# Patient Record
Sex: Female | Born: 1985 | Race: White | Hispanic: No | Marital: Married | Smoking: Never smoker
Health system: Southern US, Community
[De-identification: ages and names within clinical notes are randomized; demographics above are authoritative.]

## PROBLEM LIST (undated history)

## (undated) ENCOUNTER — Inpatient Hospital Stay (HOSPITAL_COMMUNITY): Payer: Self-pay

## (undated) DIAGNOSIS — Z8742 Personal history of other diseases of the female genital tract: Secondary | ICD-10-CM

## (undated) DIAGNOSIS — R51 Headache: Secondary | ICD-10-CM

## (undated) DIAGNOSIS — N390 Urinary tract infection, site not specified: Secondary | ICD-10-CM

## (undated) DIAGNOSIS — J45909 Unspecified asthma, uncomplicated: Secondary | ICD-10-CM

## (undated) DIAGNOSIS — O139 Gestational [pregnancy-induced] hypertension without significant proteinuria, unspecified trimester: Secondary | ICD-10-CM

## (undated) DIAGNOSIS — D649 Anemia, unspecified: Secondary | ICD-10-CM

## (undated) DIAGNOSIS — Z8619 Personal history of other infectious and parasitic diseases: Secondary | ICD-10-CM

## (undated) DIAGNOSIS — R519 Headache, unspecified: Secondary | ICD-10-CM

## (undated) HISTORY — DX: Unspecified asthma, uncomplicated: J45.909

## (undated) HISTORY — DX: Anemia, unspecified: D64.9

## (undated) HISTORY — DX: Gestational (pregnancy-induced) hypertension without significant proteinuria, unspecified trimester: O13.9

## (undated) HISTORY — DX: Headache, unspecified: R51.9

## (undated) HISTORY — DX: Personal history of other diseases of the female genital tract: Z87.42

## (undated) HISTORY — DX: Personal history of other infectious and parasitic diseases: Z86.19

## (undated) HISTORY — DX: Headache: R51

## (undated) HISTORY — PX: DIAGNOSTIC LAPAROSCOPY: SUR761

## (undated) HISTORY — DX: Urinary tract infection, site not specified: N39.0

---

## 2015-11-01 ENCOUNTER — Other Ambulatory Visit: Payer: Self-pay | Admitting: Urology

## 2015-11-01 DIAGNOSIS — G35 Multiple sclerosis: Secondary | ICD-10-CM

## 2015-11-01 DIAGNOSIS — R338 Other retention of urine: Secondary | ICD-10-CM

## 2015-11-10 ENCOUNTER — Other Ambulatory Visit (HOSPITAL_COMMUNITY): Payer: BC Managed Care – PPO

## 2015-11-11 ENCOUNTER — Ambulatory Visit (INDEPENDENT_AMBULATORY_CARE_PROVIDER_SITE_OTHER): Payer: BC Managed Care – PPO | Admitting: Neurology

## 2015-11-11 ENCOUNTER — Encounter: Payer: Self-pay | Admitting: Neurology

## 2015-11-11 VITALS — BP 135/90 | HR 88 | Ht 64.0 in | Wt 167.6 lb

## 2015-11-11 DIAGNOSIS — N390 Urinary tract infection, site not specified: Secondary | ICD-10-CM | POA: Insufficient documentation

## 2015-11-11 DIAGNOSIS — N319 Neuromuscular dysfunction of bladder, unspecified: Secondary | ICD-10-CM | POA: Diagnosis not present

## 2015-11-11 NOTE — Patient Instructions (Addendum)
I had a long discussion with the patient and her husband regarding her symptoms of frequent urinary tract infections and a   finding of inadequate emptying of the bladder in the urologist office however he clearly has not had significant neurological symptoms to suggest   multiple sclerosis. Her neurological exam is fairly unremarkable except for hyperreflexia which may be due to underlying anxiety. Doing further brain imaging studies to evaluate for multiple sclerosis or compressive lesions in the spine which may cause neurogenic bladder may be beneficial. The patient and husband want some time to think about this and make a decision. I have advised him about possible symptoms of multiple sclerosis and to call if she has no symptoms developing in the future or trouble with walking or balance. She will continue workup with her urologist and empty her bladder frequently every few hours and may in fact consider seeing a gynecologist since her frequent UTIs have started only after she became sexually active last year. She may return for follow-up in the future only as necessary Neurogenic Bladder Neurogenic bladder is a bladder control disorder. It is caused by problems with the nerves that control your bladder. This condition may make your bladder overactive or underactive. You may have trouble holding your urine or passing your urine (urinating). The bladder is a hollow organ in the lower part of your abdomen. It stores urine after the urine is made by your kidneys. Normally, when your bladder is not full, the muscles that control your bladder are relaxed. When your bladder fills with urine, nerve signals are sent to your brain, indicating that your bladder is full. Your brain then sends signals through your spinal cord to the muscles in your bladder that start and stop urine flow. If you have neurogenic bladder, the nerves and muscles do not work together the way they should. CAUSES  Any kind of nerve damage or  condition that disrupts the signals from your brain to your bladder can cause neurogenic bladder. Many things can cause these nerve problems. They include:  A disease that affects the nervous system, such as:  Alzheimer disease.  Cerebral palsy.  Multiple sclerosis.  Diabetes.  Parkinson disease.  Damage to your brain or spinal cord from:  Trauma.  Tumor.  Infection.  Surgery.  Alcohol abuse.  Stroke.  A spinal cord birth defect. RISK FACTORS Having nerve damage or a nerve disorder puts you at risk for neurogenic bladder. SIGNS AND SYMPTOMS  Signs and symptoms of neurogenic bladder include:  Leaking or gushing urine (incontinence).  A sudden, strong urge to pass urine (urgency).  Frequent urination during the day and night.  Being unable to empty your bladder completely (urinary retention).  Frequent urinary tract infections. DIAGNOSIS  Your health care provider may diagnose neurogenic bladder based on your symptoms and medical history. A physical exam will also be done. You may be asked to keep a record of your bladder symptoms and the times that you urinate (bladder diary). Your health care provider may also do several tests to help diagnose neurogenic bladder, including:  A urine test to check for infection.  A bladder scan after you urinate to see how much urine is left in your bladder.  Various tests to measure your urine flow and see how well the flow is controlled (urodynamic tests).  A procedure that involves using a tool that is like a very thin telescope to look through your urethra into your bladder (cystoscopy). A health care provider who specializes  in the urinary tract (urologist) may do this test.  Imaging tests of your brain or spine. TREATMENT  Treatment depends on the cause of your neurogenic bladder and the symptoms you are having. Work closely with your health care provider to find the treatments that will improve your quality of life.  Treatment options include:  Learning ways to control when you urinate, such as:  Urinating at scheduled times.  Training yourself to delay urination.  Doing exercises (Kegel exercises) to strengthen the muscles that control urine flow.  Avoiding foods or drinks that make your symptoms worse.  Taking medicines to:  Stimulate an underactive bladder.  Relax an overactive bladder.  Treat a urinary tract infection.  Learning how to use a thin tube (catheter) to empty your bladder. A catheter is a hollow tube that you pass through your urethra.  Procedures to stimulate the nerves that control your bladder.  Surgical procedures if other treatments do not help. HOME CARE INSTRUCTIONS   Keep a bladder diary to find out which foods, liquids, or activities make your symptoms worse.  Use your bladder diary to schedule bathroom trips. If you are away from home, plan to be near a bathroom when your schedule says you need one.  Do Kegel exercises to strengthen the muscles that control urination. These muscles are the ones you use to try to hold urine when you need to go. To do Kegel exercises:  Squeeze these muscles tight and hold for about 10 seconds.  Repeat three times.  Do these exercises often during the day when you do not have to urinate.  Limit your drinking of beverages that stimulate urination. These include soda, coffee, and tea.  After urinating, wait a few minutes and try again (double voiding).  Make sure you urinate just before you leave the house and just before you go to bed.  Take medicines only as directed by your health care provider.  Keep all follow-up visits as directed by your health care provider. This is important. SEEK MEDICAL CARE IF:   You are having a hard time controlling your symptoms.  Your symptoms are getting worse.  You have signs of a urinary tract infection:  A burning feeling when you urinate.  Chills.  Fever. SEEK IMMEDIATE MEDICAL  CARE IF:  You cannot pass urine.    This information is not intended to replace advice given to you by your health care provider. Make sure you discuss any questions you have with your health care provider.   Document Released: 01/28/2007 Document Revised: 08/07/2014 Document Reviewed: 10/28/2013 Elsevier Interactive Patient Education [employer]

## 2015-11-11 NOTE — Progress Notes (Signed)
[facility] [address] [place]. [place] [address] [phone]       OFFICE CONSULT NOTE  [name] Date of Birth:  1985/11/13 Medical Record Number:  [id number]   Referring MD:  Dr Ronne Binning Reason for Referral:  Neurogenic bladder, frequent UTIs  HPI: Ms Anne Russell is a 86 year Caucasian lady who has had frequent urinary tract infections since August 2016. She got married in July 2016 and had not been sexually active prior to that. She had 2 episodes of culture positive UTI treated with antibiotics. She was seen by urologist and had some trouble emptying her bladder in the office. The available records sent with this referral do not indicate urodynamic studies. Patient denies neurological symptoms in the form of back pain, radicular pain, perianal anesthesia, vertigo, diplopia, gait or balance problems or excessive fatigue. 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 She has occasional tension headaches but no other neurological symptoms. She has not had any imaging study done of the brain or spine. She has no history of frequent falls, trauma to her spine, motor vehicle accident degenerative spine disease. The patient states the initial UTIs began a day or 2 after having sex. More recently there does not seem to be any correlation between her symptoms of bladder, pelvic pain and dysuria and sex.  ROS:   14 system review of systems is positive for frequent UTI, incomplete bladder emptying, anxiety, stress and all other systems negative  PMH:  Past Medical History  Diagnosis Date  . Asthma   . UTI (lower urinary tract infection)     Social History:  Social History   Social History  . Marital Status: Married    Spouse Name: N/A  . Number of Children: N/A  . Years of Education: N/A   Occupational History  . Not on file.   Social History Main Topics  . Smoking status: Never Smoker   . Smokeless tobacco: Not on file  .  Alcohol Use: 0.6 oz/week    1 Glasses of wine per week     Comment: occasionally  . Drug Use: Not on file  . Sexual Activity: Not on file   Other Topics Concern  . Not on file   Social History Narrative    Medications:   No current outpatient prescriptions on file prior to visit.   No current facility-administered medications on file prior to visit.    Allergies:   Allergies  Allergen Reactions  . Sulfa Antibiotics     Physical Exam General: well developed, well nourished Young Caucasian lady, seated, in no evident distress Head: head normocephalic and atraumatic.   Neck: supple with no carotid or supraclavicular bruits Cardiovascular: regular rate and rhythm, no murmurs Musculoskeletal: no deformity Skin:  no rash/petichiae Vascular:  Normal pulses all extremities  Neurologic Exam Mental Status: Awake and fully alert. Oriented to place and time. Recent and remote memory intact. Attention span, concentration and fund of knowledge appropriate. Mood and affect appropriate.  Cranial Nerves: Fundoscopic exam reveals sharp disc margins. Pupils equal, briskly reactive to light.No afferent pupillary defect. Extraocular movements full without nystagmus. Visual fields full to confrontation. Hearing intact. Facial sensation intact. Face, tongue, palate moves normally and symmetrically.  Motor: Normal bulk and tone. Normal strength in all tested extremity muscles. Sensory.: intact to touch , pinprick , position and vibratory sensation.  Coordination: Rapid alternating movements normal in all extremities. Finger-to-nose and heel-to-shin performed accurately bilaterally. Gait and Station:  Arises from chair without difficulty. Stance is normal. Gait demonstrates normal stride length and balance . Able to heel, toe and tandem walk without difficulty.  Reflexes: 2+ brisk and symmetric. Toes downgoing.       ASSESSMENT: 51 year Caucasian lady with a recurrent urinary tract infections  with incomplete bladder emptying of unclear etiology. Primary neurogenic bladder due to cauda equina lesion or multiple sclerosis-like illnesses unlikely given lack of any other associated symptoms and patient's UTIs beginning after she became sexually active. Neurological exam is unremarkable except for mild hyperreflexia likely related to underlying anxiety    PLAN: I had a long discussion with the patient and her husband regarding her symptoms of frequent urinary tract infections and a   finding of inadequate emptying of the bladder in the urologist office however he clearly has not had significant neurological symptoms to suggest   multiple sclerosis. Her neurological exam is fairly unremarkable except for hyperreflexia which may be due to underlying anxiety. Doing further brain imaging studies to evaluate for multiple sclerosis or compressive lesions in the spine which may cause neurogenic bladder may be beneficial. The patient and husband want some time to think about this and make a decision. I have advised him about possible symptoms of multiple sclerosis and to call if she has no symptoms developing in the future or trouble with walking or balance. She will continue workup with her urologist and empty her bladder frequently every few hours and may in fact consider seeing a gynecologist since her frequent UTIs have started only after she became sexually active last year. She may return for follow-up in the future only as necessary Delia Heady, MD Note: This document was prepared with digital dictation and possible smart phrase technology. Any transcriptional errors that result from this process are unintentional.

## 2016-03-21 LAB — OB RESULTS CONSOLE HIV ANTIBODY (ROUTINE TESTING): HIV: NONREACTIVE

## 2016-03-21 LAB — OB RESULTS CONSOLE HEPATITIS B SURFACE ANTIGEN: Hepatitis B Surface Ag: NEGATIVE

## 2016-03-21 LAB — OB RESULTS CONSOLE RUBELLA ANTIBODY, IGM: Rubella: IMMUNE

## 2016-03-21 LAB — OB RESULTS CONSOLE ABO/RH: RH TYPE: NEGATIVE

## 2016-03-21 LAB — OB RESULTS CONSOLE RPR: RPR: NONREACTIVE

## 2016-03-21 LAB — OB RESULTS CONSOLE GC/CHLAMYDIA
Chlamydia: NEGATIVE
Gonorrhea: NEGATIVE

## 2016-03-21 LAB — OB RESULTS CONSOLE ANTIBODY SCREEN: Antibody Screen: NEGATIVE

## 2016-07-31 NOTE — L&D Delivery Note (Signed)
Delivery Note At 9:42 PM a viable female was delivered via Vaginal, Spontaneous Delivery (Presentation:OA ;  ).  APGAR: 8, 9; weight  .   Placenta statusspont>>intact: , .  Cord:  with the following complications:loose nuchal X 1  .  Cord pH: not sent  Anesthesia:  epid + loc Episiotomy: None Lacerations:  Sec deg + L periuretharal Suture Repair: 3.0 vicryl rapide Est. Blood Loss (mL):  300  Mom to postpartum.  Baby to Couplet care / Skin to Skin.  Meriel Pica 10/12/2016, 10:05 PM

## 2016-10-11 ENCOUNTER — Telehealth (HOSPITAL_COMMUNITY): Payer: Self-pay | Admitting: *Deleted

## 2016-10-11 ENCOUNTER — Other Ambulatory Visit: Payer: Self-pay | Admitting: Obstetrics and Gynecology

## 2016-10-11 ENCOUNTER — Encounter (HOSPITAL_COMMUNITY): Payer: Self-pay | Admitting: *Deleted

## 2016-10-11 LAB — OB RESULTS CONSOLE GBS: GBS: NEGATIVE

## 2016-10-11 NOTE — Telephone Encounter (Signed)
Preadmission screen  

## 2016-10-12 ENCOUNTER — Inpatient Hospital Stay (HOSPITAL_COMMUNITY): Payer: BC Managed Care – PPO | Admitting: Anesthesiology

## 2016-10-12 ENCOUNTER — Encounter (HOSPITAL_COMMUNITY): Payer: Self-pay

## 2016-10-12 ENCOUNTER — Inpatient Hospital Stay (HOSPITAL_COMMUNITY)
Admission: RE | Admit: 2016-10-12 | Discharge: 2016-10-14 | DRG: 775 | Disposition: A | Discharge status: Home / Self Care | Payer: BC Managed Care – PPO | Source: Ambulatory Visit | Attending: Obstetrics and Gynecology | Admitting: Obstetrics and Gynecology

## 2016-10-12 VITALS — BP 132/82 | HR 82 | Temp 98.3°F | Resp 18 | Ht 64.0 in | Wt 186.0 lb

## 2016-10-12 DIAGNOSIS — O134 Gestational [pregnancy-induced] hypertension without significant proteinuria, complicating childbirth: Secondary | ICD-10-CM | POA: Diagnosis present

## 2016-10-12 DIAGNOSIS — Z3A38 38 weeks gestation of pregnancy: Secondary | ICD-10-CM

## 2016-10-12 DIAGNOSIS — Z8249 Family history of ischemic heart disease and other diseases of the circulatory system: Secondary | ICD-10-CM | POA: Diagnosis not present

## 2016-10-12 DIAGNOSIS — O139 Gestational [pregnancy-induced] hypertension without significant proteinuria, unspecified trimester: Secondary | ICD-10-CM | POA: Diagnosis present

## 2016-10-12 LAB — CBC
HCT: 40.6 % (ref 36.0–46.0)
HEMATOCRIT: 41.9 % (ref 36.0–46.0)
HEMOGLOBIN: 13.8 g/dL (ref 12.0–15.0)
Hemoglobin: 14.4 g/dL (ref 12.0–15.0)
MCH: 30.2 pg (ref 26.0–34.0)
MCH: 30.6 pg (ref 26.0–34.0)
MCHC: 34 g/dL (ref 30.0–36.0)
MCHC: 34.4 g/dL (ref 30.0–36.0)
MCV: 88.8 fL (ref 78.0–100.0)
MCV: 89.1 fL (ref 78.0–100.0)
PLATELETS: 163 10*3/uL (ref 150–400)
Platelets: 155 10*3/uL (ref 150–400)
RBC: 4.57 MIL/uL (ref 3.87–5.11)
RBC: 4.7 MIL/uL (ref 3.87–5.11)
RDW: 13.1 % (ref 11.5–15.5)
RDW: 13.1 % (ref 11.5–15.5)
WBC: 14.3 10*3/uL — ABNORMAL HIGH (ref 4.0–10.5)
WBC: 18.6 10*3/uL — AB (ref 4.0–10.5)

## 2016-10-12 LAB — RPR: RPR Ser Ql: NONREACTIVE

## 2016-10-12 LAB — ABO/RH: ABO/RH(D): O NEG

## 2016-10-12 LAB — TYPE AND SCREEN
ABO/RH(D): O NEG
ANTIBODY SCREEN: NEGATIVE

## 2016-10-12 MED ORDER — OXYTOCIN 40 UNITS IN LACTATED RINGERS INFUSION - SIMPLE MED
2.5000 [IU]/h | INTRAVENOUS | Status: DC
  Filled 2016-10-12: qty 1000

## 2016-10-12 MED ORDER — LIDOCAINE HCL (PF) 1 % IJ SOLN
30.0000 mL | INTRAMUSCULAR | Status: DC | PRN
  Administered 2016-10-12: 30 mL via SUBCUTANEOUS
  Filled 2016-10-12 (×2): qty 30

## 2016-10-12 MED ORDER — LACTATED RINGERS IV SOLN: 500.0000 mL | INTRAVENOUS | Status: DC | PRN

## 2016-10-12 MED ORDER — ACETAMINOPHEN 325 MG PO TABS
650.0000 mg | ORAL_TABLET | ORAL | Status: DC | PRN
  Administered 2016-10-13 – 2016-10-14 (×4): 650 mg via ORAL
  Filled 2016-10-12 (×4): qty 2

## 2016-10-12 MED ORDER — OXYCODONE-ACETAMINOPHEN 5-325 MG PO TABS
2.0000 | ORAL_TABLET | ORAL | Status: DC | PRN
  Filled 2016-10-12: qty 2

## 2016-10-12 MED ORDER — DIPHENHYDRAMINE HCL 50 MG/ML IJ SOLN: 12.5000 mg | INTRAMUSCULAR | Status: DC | PRN

## 2016-10-12 MED ORDER — ZOLPIDEM TARTRATE 5 MG PO TABS: 5.0000 mg | ORAL_TABLET | Freq: Every evening | ORAL | Status: DC | PRN

## 2016-10-12 MED ORDER — TERBUTALINE SULFATE 1 MG/ML IJ SOLN
0.2500 mg | Freq: Once | INTRAMUSCULAR | Status: DC | PRN
  Filled 2016-10-12: qty 1

## 2016-10-12 MED ORDER — DIBUCAINE 1 % RE OINT
1.0000 "application " | TOPICAL_OINTMENT | RECTAL | Status: DC | PRN
  Administered 2016-10-13 – 2016-10-14 (×2): 1 via RECTAL
  Filled 2016-10-12 (×2): qty 28

## 2016-10-12 MED ORDER — MISOPROSTOL 25 MCG QUARTER TABLET
25.0000 ug | ORAL_TABLET | ORAL | Status: DC | PRN
  Administered 2016-10-12 (×2): 25 ug via VAGINAL
  Filled 2016-10-12 (×2): qty 0.25
  Filled 2016-10-12: qty 1

## 2016-10-12 MED ORDER — FLEET ENEMA 7-19 GM/118ML RE ENEM: 1.0000 | ENEMA | RECTAL | Status: DC | PRN

## 2016-10-12 MED ORDER — SOD CITRATE-CITRIC ACID 500-334 MG/5ML PO SOLN: 30.0000 mL | ORAL | Status: DC | PRN

## 2016-10-12 MED ORDER — IBUPROFEN 800 MG PO TABS
800.0000 mg | ORAL_TABLET | Freq: Three times a day (TID) | ORAL | Status: DC | PRN
  Administered 2016-10-13 – 2016-10-14 (×5): 800 mg via ORAL
  Filled 2016-10-12 (×5): qty 1

## 2016-10-12 MED ORDER — WITCH HAZEL-GLYCERIN EX PADS
1.0000 "application " | MEDICATED_PAD | CUTANEOUS | Status: DC | PRN
  Administered 2016-10-13: 1 via TOPICAL

## 2016-10-12 MED ORDER — BISACODYL 10 MG RE SUPP: 10.0000 mg | Freq: Every day | RECTAL | Status: DC | PRN

## 2016-10-12 MED ORDER — OXYTOCIN 40 UNITS IN LACTATED RINGERS INFUSION - SIMPLE MED
1.0000 m[IU]/min | INTRAVENOUS | Status: DC
  Administered 2016-10-12: 2 m[IU]/min via INTRAVENOUS

## 2016-10-12 MED ORDER — MEASLES, MUMPS & RUBELLA VAC ~~LOC~~ INJ
0.5000 mL | INJECTION | Freq: Once | SUBCUTANEOUS | Status: DC
  Filled 2016-10-12: qty 0.5

## 2016-10-12 MED ORDER — PHENYLEPHRINE 40 MCG/ML (10ML) SYRINGE FOR IV PUSH (FOR BLOOD PRESSURE SUPPORT)
80.0000 ug | PREFILLED_SYRINGE | INTRAVENOUS | Status: DC | PRN
  Filled 2016-10-12: qty 10
  Filled 2016-10-12: qty 5

## 2016-10-12 MED ORDER — ACETAMINOPHEN 325 MG PO TABS: 650.0000 mg | ORAL_TABLET | ORAL | Status: DC | PRN

## 2016-10-12 MED ORDER — NALOXONE HCL 0.4 MG/ML IJ SOLN
INTRAMUSCULAR | Status: AC
  Filled 2016-10-12: qty 1

## 2016-10-12 MED ORDER — FLEET ENEMA 7-19 GM/118ML RE ENEM: 1.0000 | ENEMA | Freq: Every day | RECTAL | Status: DC | PRN

## 2016-10-12 MED ORDER — SENNOSIDES-DOCUSATE SODIUM 8.6-50 MG PO TABS
2.0000 | ORAL_TABLET | ORAL | Status: DC
  Administered 2016-10-13 (×2): 2 via ORAL
  Filled 2016-10-12 (×2): qty 2

## 2016-10-12 MED ORDER — PHENYLEPHRINE 40 MCG/ML (10ML) SYRINGE FOR IV PUSH (FOR BLOOD PRESSURE SUPPORT)
80.0000 ug | PREFILLED_SYRINGE | INTRAVENOUS | Status: DC | PRN
  Filled 2016-10-12: qty 5
  Filled 2016-10-12: qty 10

## 2016-10-12 MED ORDER — PRENATAL MULTIVITAMIN CH: 1.0000 | ORAL_TABLET | Freq: Every day | ORAL | Status: DC

## 2016-10-12 MED ORDER — TETANUS-DIPHTH-ACELL PERTUSSIS 5-2.5-18.5 LF-MCG/0.5 IM SUSP: 0.5000 mL | Freq: Once | INTRAMUSCULAR | Status: DC

## 2016-10-12 MED ORDER — ONDANSETRON HCL 4 MG/2ML IJ SOLN: 4.0000 mg | INTRAMUSCULAR | Status: DC | PRN

## 2016-10-12 MED ORDER — BUTORPHANOL TARTRATE 1 MG/ML IJ SOLN
1.0000 mg | Freq: Once | INTRAMUSCULAR | Status: AC
  Administered 2016-10-12: 1 mg via INTRAVENOUS
  Filled 2016-10-12: qty 1

## 2016-10-12 MED ORDER — LACTATED RINGERS IV SOLN
500.0000 mL | Freq: Once | INTRAVENOUS | Status: AC
  Administered 2016-10-12: 500 mL via INTRAVENOUS

## 2016-10-12 MED ORDER — SIMETHICONE 80 MG PO CHEW: 80.0000 mg | CHEWABLE_TABLET | ORAL | Status: DC | PRN

## 2016-10-12 MED ORDER — BENZOCAINE-MENTHOL 20-0.5 % EX AERO
1.0000 "application " | INHALATION_SPRAY | CUTANEOUS | Status: DC | PRN
  Administered 2016-10-13: 1 via TOPICAL
  Filled 2016-10-12: qty 56

## 2016-10-12 MED ORDER — ONDANSETRON HCL 4 MG PO TABS: 4.0000 mg | ORAL_TABLET | ORAL | Status: DC | PRN

## 2016-10-12 MED ORDER — LACTATED RINGERS IV SOLN
INTRAVENOUS | Status: DC
  Administered 2016-10-12: 01:00:00 via INTRAVENOUS

## 2016-10-12 MED ORDER — OXYTOCIN BOLUS FROM INFUSION
500.0000 mL | Freq: Once | INTRAVENOUS | Status: AC
  Administered 2016-10-12: 500 mL via INTRAVENOUS

## 2016-10-12 MED ORDER — DIPHENHYDRAMINE HCL 25 MG PO CAPS: 25.0000 mg | ORAL_CAPSULE | Freq: Four times a day (QID) | ORAL | Status: DC | PRN

## 2016-10-12 MED ORDER — EPHEDRINE 5 MG/ML INJ
10.0000 mg | INTRAVENOUS | Status: DC | PRN
  Filled 2016-10-12: qty 2

## 2016-10-12 MED ORDER — OXYCODONE-ACETAMINOPHEN 5-325 MG PO TABS: 1.0000 | ORAL_TABLET | ORAL | Status: DC | PRN

## 2016-10-12 MED ORDER — LIDOCAINE HCL (PF) 1 % IJ SOLN
INTRAMUSCULAR | Status: DC | PRN
  Administered 2016-10-12: 8 mL via EPIDURAL
  Administered 2016-10-12: 7 mL via EPIDURAL

## 2016-10-12 MED ORDER — FENTANYL 2.5 MCG/ML BUPIVACAINE 1/10 % EPIDURAL INFUSION (WH - ANES)
14.0000 mL/h | INTRAMUSCULAR | Status: DC | PRN
  Filled 2016-10-12: qty 100

## 2016-10-12 MED ORDER — COCONUT OIL OIL: 1.0000 "application " | TOPICAL_OIL | Status: DC | PRN

## 2016-10-12 MED ORDER — ONDANSETRON HCL 4 MG/2ML IJ SOLN: 4.0000 mg | Freq: Four times a day (QID) | INTRAMUSCULAR | Status: DC | PRN

## 2016-10-12 NOTE — Anesthesia Preprocedure Evaluation (Signed)
Anesthesia Evaluation  Patient identified by MRN, date of birth, ID band Patient awake    Reviewed: Allergy & Precautions, H&P , NPO status , Patient's Chart, lab work & pertinent test results  Airway Mallampati: I  TM Distance: >3 FB Neck ROM: full    Dental no notable dental hx.    Pulmonary    Pulmonary exam normal breath sounds clear to auscultation       Cardiovascular Normal cardiovascular exam     Neuro/Psych negative psych ROS   GI/Hepatic negative GI ROS, Neg liver ROS,   Endo/Other  negative endocrine ROS  Renal/GU negative Renal ROS     Musculoskeletal   Abdominal (+) + obese,   Peds  Hematology   Anesthesia Other Findings   Reproductive/Obstetrics (+) Pregnancy                             Anesthesia Physical Anesthesia Plan  ASA: II  Anesthesia Plan: Epidural   Post-op Pain Management:    Induction:   Airway Management Planned:   Additional Equipment:   Intra-op Plan:   Post-operative Plan:   Informed Consent: I have reviewed the patients History and Physical, chart, labs and discussed the procedure including the risks, benefits and alternatives for the proposed anesthesia with the patient or authorized representative who has indicated his/her understanding and acceptance.     Plan Discussed with:   Anesthesia Plan Comments:         Anesthesia Quick Evaluation

## 2016-10-12 NOTE — Progress Notes (Signed)
1.5 cm>>>ISE for AROM>>>clear, discussed pain mgmt, FHR cat I

## 2016-10-12 NOTE — Anesthesia Pain Management Evaluation Note (Signed)
  CRNA Pain Management Visit Note  Patient: Anne Russell, 31 y.o., female  "Hello I am a member of the anesthesia team at [facility]. We have an anesthesia team available at all times to provide care throughout the hospital, including epidural management and anesthesia for C-section. I don't know your plan for the delivery whether it a natural birth, water birth, IV sedation, nitrous supplementation, doula or epidural, but we want to meet your pain goals."   1.Was your pain managed to your expectations on prior hospitalizations?   No prior hospitalizations  2.What is your expectation for pain management during this hospitalization?     Epidural  3.How can we help you reach that goal? epidural  Record the patient's initial score and the patient's pain goal.   Pain: 0  Pain Goal: 5 [facility] wants you to be able to say your pain was always managed very well.  Lauren Aguayo 10/12/2016

## 2016-10-12 NOTE — H&P (Signed)
Anne Russell is a 31 y.o. female presenting for IOL due to gest HTN, PIH labs in office WNL 2 days ago. OB History    Gravida Para Term Preterm AB Living   1             SAB TAB Ectopic Multiple Live Births                 Past Medical History:  Diagnosis Date  . Anemia   . Asthma   . Headache   . History of endometriosis   . Hx of varicella   . UTI (lower urinary tract infection)    Past Surgical History:  Procedure Laterality Date  . DIAGNOSTIC LAPAROSCOPY     Family History: family history includes Breast cancer in her maternal aunt and paternal aunt; Colon cancer in her maternal grandmother; Heart attack in her maternal grandfather; Hyperlipidemia in her mother; Kidney Stones in her father; Urinary tract infection in her mother. Social History:  reports that she has never smoked. She has never used smokeless tobacco. She reports that she drinks about 0.6 oz of alcohol per week . Her drug history is not on file.     Maternal Diabetes: No Genetic Screening: Normal Maternal Ultrasounds/Referrals: Normal Fetal Ultrasounds or other Referrals:  None Maternal Substance Abuse:  No Significant Maternal Medications:  None Significant Maternal Lab Results:  None Other Comments:  None  ROS History Dilation: Fingertip Effacement (%): 50 Station: -3 Exam by:: AThersa Salt RN  Blood pressure 129/73, pulse 69, temperature 97.3 F (36.3 C), temperature source Oral, resp. rate 16, height 5\' 4"  (1.626 m), weight 84.4 kg (186 lb), last menstrual period 01/19/2016. Exam Physical Exam  Constitutional: She is oriented to person, place, and time. She appears well-developed and well-nourished.  HENT:  Head: Normocephalic and atraumatic.  Neck: Normal range of motion.  Cardiovascular: Normal rate and regular rhythm.   Respiratory: Effort normal and breath sounds normal.  GI:  Term FH, FHR 142  Genitourinary:  Genitourinary Comments: cx: cl/post/vtx  Musculoskeletal: Normal range of  motion.  Neurological: She is alert and oriented to person, place, and time.    Prenatal labs: ABO, Rh: --/--/O NEG (03/15 0125) Antibody: NEG (03/15 0123) Rubella: Immune (08/22 0000) RPR: Nonreactive (08/22 0000)  HBsAg: Negative (08/22 0000)  HIV: Non-reactive (08/22 0000)  GBS: Negative (03/14 0000)   Assessment/Plan: Term IUP, gest HTN   Lary Eckardt M 10/12/2016, 7:38 AM

## 2016-10-12 NOTE — Anesthesia Procedure Notes (Signed)
Epidural Patient location during procedure: OB Start time: 10/12/2016 8:39 PM End time: 10/12/2016 8:41 PM  Staffing Anesthesiologist: Leilani Able Performed: anesthesiologist   Preanesthetic Checklist Completed: patient identified, surgical consent, pre-op evaluation, timeout performed, IV checked, risks and benefits discussed and monitors and equipment checked  Epidural Patient position: sitting Prep: site prepped and draped and DuraPrep Patient monitoring: continuous pulse ox and blood pressure Approach: midline Location: L3-L4 Injection technique: LOR air  Needle:  Needle type: Tuohy  Needle gauge: 17 G Needle length: 9 cm and 9 Needle insertion depth: 4 cm Catheter type: closed end flexible Catheter size: 19 Gauge Catheter at skin depth: 9 cm Test dose: negative and Other  Assessment Sensory level: T9 Events: blood not aspirated, injection not painful, no injection resistance, negative IV test and no paresthesia  Additional Notes Reason for block:procedure for pain

## 2016-10-13 ENCOUNTER — Inpatient Hospital Stay (HOSPITAL_COMMUNITY): Admission: RE | Admit: 2016-10-13 | Payer: BC Managed Care – PPO | Source: Ambulatory Visit

## 2016-10-13 LAB — CBC
HCT: 35.6 % — ABNORMAL LOW (ref 36.0–46.0)
Hemoglobin: 12.2 g/dL (ref 12.0–15.0)
MCH: 30.5 pg (ref 26.0–34.0)
MCHC: 34.3 g/dL (ref 30.0–36.0)
MCV: 89 fL (ref 78.0–100.0)
PLATELETS: 140 10*3/uL — AB (ref 150–400)
RBC: 4 MIL/uL (ref 3.87–5.11)
RDW: 13.2 % (ref 11.5–15.5)
WBC: 18.5 10*3/uL — AB (ref 4.0–10.5)

## 2016-10-13 NOTE — Progress Notes (Signed)
Post Partum Day 1 Subjective: no complaints, up ad lib, voiding and tolerating PO  Objective: Blood pressure 135/73, pulse 72, temperature 97.4 F (36.3 C), temperature source Oral, resp. rate 17, height 5\' 4"  (1.626 m), weight 186 lb (84.4 kg), last menstrual period 01/19/2016, SpO2 99 %, unknown if currently breastfeeding.  Physical Exam:  General: alert and cooperative Lochia: appropriate Uterine Fundus: firm Incision: healing well DVT Evaluation: No evidence of DVT seen on physical exam. Negative Homan's sign. No cords or calf tenderness. No significant calf/ankle edema.   Recent Labs  10/12/16 2015 10/13/16 0548  HGB 14.4 12.2  HCT 41.9 35.6*    Assessment/Plan: Plan for discharge tomorrow and Circumcision prior to discharge   LOS: 1 day   CURTIS,CAROL G 10/13/2016, 8:27 AM

## 2016-10-13 NOTE — Lactation Note (Signed)
This note was copied from a baby's chart. Lactation Consultation Note  Patient Name: Anne Russell [id number] Date: 10/13/2016 Reason for consult: Initial assessment;Infant < 6lbs Breastfeeding consultation services and support information given and reviewed.  I also gave parents Caring For Your Late Preterm Baby handout and explained due to baby's small size much of the information applies to their baby.  Mom has been hand expressing and spoon feeding colostrum.  Baby is currently very sleepy.  Assisted with positioning baby skin to skin in football hold.  Colostrum easily started with hand expression.  Baby only holds nipple in his mouth with no sucking attempt. Worked with suck training using a gloved finger with colostrum.  Baby would not suck initially but after several minutes of working with baby he began suckling.  3 mls of colostrum given with a curved tip syringe.  Baby tolerated well but still a little gaggy/spitty.  Symphony pump set up and initiated.  Instructed mom to attempt latching baby with cues, post pump x 15 minutes, hand expression and supplement with any expressed milk using curved tip syringe.  Instructed parents to call for RN assist with curved tip syringe next feeding.  Mom has a Medela pump in style at home.  Maternal Data Has patient been taught Hand Expression?: Yes Does the patient have breastfeeding experience prior to this delivery?: No  Feeding Feeding Type: Breast Fed  LATCH Score/Interventions Latch: Too sleepy or reluctant, no latch achieved, no sucking elicited. Intervention(s): Skin to skin;Teach feeding cues;Waking techniques Intervention(s): Breast compression;Breast massage;Assist with latch;Adjust position  Audible Swallowing: None Intervention(s): Skin to skin;Hand expression  Type of Nipple: Everted at rest and after stimulation  Comfort (Breast/Nipple): Soft / non-tender     Hold (Positioning): Assistance needed to correctly position  infant at breast and maintain latch. Intervention(s): Breastfeeding basics reviewed;Support Pillows;Position options;Skin to skin  LATCH Score: 5  Lactation Tools Discussed/Used Pump Review: Setup, frequency, and cleaning;Milk Storage Initiated by:: LC Date initiated:: 10/13/16   Consult Status Consult Status: Follow-up Date: 10/14/16 Follow-up type: In-patient    Huston Foley 10/13/2016, 2:48 PM

## 2016-10-13 NOTE — Anesthesia Postprocedure Evaluation (Signed)
Anesthesia Post Note  Patient: Anne Russell  Procedure(s) Performed: * No procedures listed *  Patient location during evaluation: Mother Baby Anesthesia Type: Epidural Level of consciousness: awake Pain management: pain level controlled Vital Signs Assessment: post-procedure vital signs reviewed and stable Respiratory status: spontaneous breathing Cardiovascular status: stable Postop Assessment: no headache, no backache, epidural receding and patient able to bend at knees Anesthetic complications: no        Last Vitals:  Vitals:   10/13/16 0525 10/13/16 0613  BP: (!) 154/73 135/73  Pulse: (!) 58 72  Resp: 17   Temp: 36.3 C     Last Pain:  Vitals:   10/13/16 0525  TempSrc: Oral  PainSc: 2    Pain Goal:                 Edison Pace

## 2016-10-13 NOTE — Plan of Care (Signed)
Problem: Pain Managment: Goal: General experience of comfort will improve Outcome: Progressing Patient is managing her perineal and rectal discomfort with po meds, spray, ice and Tuks pads and ointment. She is taking tylenol and Ibuprofen as needed. She is aware of other medications but does not want due to potential for constipation. Informed of stool softeners ordered, increased fluids and fiber in diet. Patient will call if pain med needed.

## 2016-10-14 MED ORDER — IBUPROFEN 600 MG PO TABS: 600.0000 mg | ORAL_TABLET | Freq: Four times a day (QID) | ORAL | 0 refills | Status: DC | PRN

## 2016-10-14 MED ORDER — FERROUS SULFATE 325 (65 FE) MG PO TBEC: 325.0000 mg | DELAYED_RELEASE_TABLET | Freq: Two times a day (BID) | ORAL | 2 refills | Status: DC

## 2016-10-14 MED ORDER — DOCUSATE SODIUM 100 MG PO CAPS: 100.0000 mg | ORAL_CAPSULE | Freq: Two times a day (BID) | ORAL | 2 refills | Status: DC

## 2016-10-14 NOTE — Plan of Care (Signed)
Problem: Pain Management: Goal: General experience of comfort will improve and pain level will decrease Outcome: Progressing Patient with hemorrhoid pain. Seat cushion given and educated/ administered a sitz bath.

## 2016-10-14 NOTE — Progress Notes (Signed)
Post Partum Day 2 Subjective: no complaints, up ad lib, voiding and tolerating PO. No CP, SOB, RUQ pain, epigastric pain, etc.   Objective: Vitals:   10/13/16 1845 10/14/16 0530  BP: (!) 146/92 132/82  Pulse: 96 82  Resp: 16 18  Temp: 98.1 F (36.7 C) 98.3 F (36.8 C)   , last menstrual period 01/19/2016, SpO2 99 %,breastfeeding.  Physical Exam:  General: alert and cooperative Lochia: appropriate Uterine Fundus: firm Incision: healing well DVT Evaluation: No evidence of DVT seen on physical exam. Negative Homan's sign. No cords or calf tenderness. No significant calf/ankle edema.   Recent Labs (last 2 labs)    Recent Labs  10/12/16 2015 10/13/16 0548  HGB 14.4 12.2  HCT 41.9 35.6*      Assessment/Plan: Plan for discharge today. H/o gHTN - mild elevated in BP - no s/s pre-eclampsia. Will have BP check in clinic in one week.   Rosie Fate MD

## 2016-10-14 NOTE — Lactation Note (Signed)
This note was copied from a baby's chart. Lactation Consultation Note: Baby just back from circ and too sleepy to nurse. Mom reports he has done better the last few feeding using a NS. Mom pumping and obtaining a few drops of colostrum. Has not fed since 5:30 am. Suggested pumping and offering finger feeding. #5 fr feeding tube/syringe given with instructions for setup, use and cleaning of pieces to supplement at the breast or by finger feeding. No questions at present. To call for assist when baby wakes for feeding. Possible DC later today.   Patient Name: Anne Russell [id number] Date: 10/14/2016 Reason for consult: Follow-up assessment;Infant < 6lbs   Maternal Data Formula Feeding for Exclusion: No Has patient been taught Hand Expression?: Yes Does the patient have breastfeeding experience prior to this delivery?: No  Feeding    LATCH Score/Interventions Latch: Too sleepy or reluctant, no latch achieved, no sucking elicited.  Audible Swallowing: None                 Lactation Tools Discussed/Used WIC Program: No   Consult Status Consult Status: Follow-up Date: 10/14/16 Follow-up type: In-patient    Pamelia Hoit 10/14/2016, 10:33 AM

## 2016-10-14 NOTE — Discharge Summary (Signed)
Obstetric Discharge Summary Reason for Admission: induction of labor for gHTN Prenatal Procedures: none Intrapartum Procedures: spontaneous vaginal delivery Postpartum Procedures: none Complications-Operative and Postpartum: none Hemoglobin  Date Value Ref Range Status  10/13/2016 12.2 12.0 - 15.0 g/dL Final   HCT  Date Value Ref Range Status  10/13/2016 35.6 (L) 36.0 - 46.0 % Final    Physical Exam:  General: alert, cooperative and appears stated age Lochia: appropriate Uterine Fundus: firm Incision: N/A DVT Evaluation: No evidence of DVT seen on physical exam. Negative Homan's sign. No cords or calf tenderness. No significant calf/ankle edema.  Discharge Diagnoses: Term Pregnancy-delivered  Discharge Information: Date: 10/14/2016 Activity: pelvic rest Diet: routine Medications: Ibuprofen, Colace and Iron Condition: stable Instructions: refer to practice specific booklet Discharge to: home  Please make an appt for next week for BP check.  Newborn Data: Live born female  Birth Weight: 5 lb 4.3 oz (2390 g) APGAR: 8, 9  Home with mother.  Anne Russell 10/14/2016, 8:20 AM

## 2016-10-14 NOTE — Lactation Note (Signed)
This note was copied from a baby's chart. Lactation Consultation Note: Called to assist mom with latch, Baby waking after circ used # 24 NS with some Colostrum and baby latched well and nursed for 15 min then getting sleepy -had about 2 ml Colostrum in NS. Mom to eat lunch then will pump. Encouragement given. No questions at present. To call for assist prn  Patient Name: Anne Russell [id number] Date: 10/14/2016 Reason for consult: Follow-up assessment;Infant < 6lbs   Maternal Data    Feeding Feeding Type: Breast Fed Length of feed: 15 min  LATCH Score/Interventions Latch: Grasps breast easily, tongue down, lips flanged, rhythmical sucking.  Audible Swallowing: A few with stimulation  Type of Nipple: Everted at rest and after stimulation  Comfort (Breast/Nipple): Soft / non-tender     Hold (Positioning): Assistance needed to correctly position infant at breast and maintain latch. Intervention(s): Breastfeeding basics reviewed  LATCH Score: 8  Lactation Tools Discussed/Used Tools: Nipple Shields Nipple shield size: 24   Consult Status Consult Status: Follow-up    Pamelia Hoit 10/14/2016, 3:03 PM

## 2016-10-15 ENCOUNTER — Ambulatory Visit: Payer: Self-pay

## 2016-10-15 NOTE — Lactation Note (Signed)
This note was copied from a baby's chart. Lactation Consultation Note  Patient Name: Anne Russell [id number] Date: 10/15/2016 Reason for consult: Follow-up assessment   With this first time mom and term, but small baby. He is now 75 hours old, and 4 lbs 13.8 oz, at 8 % weight loss. Mom's milk is transitioning in, and I was able to hand express about 4 ml's from her right breast. Mom had just breast fed on her left, and I was able to hand express less than 1 ml. Mom is pumping every 2-3 hours, after breast feeding, and feeding EBm by syring into the shield. Mom is see lactation at her peds office tomorrow, Anne Russell. I gave mom an extra curved tip syring and 24 nipple shield. Pump part care reviewed. Mom knows to call for questions/concerns.    Maternal Data    Feeding Feeding Type: Breast Fed Length of feed: 20 min  LATCH Score/Interventions                      Lactation Tools Discussed/Used Tools: Nipple Shields   Consult Status Consult Status: Complete Follow-up type: Call as needed    Alfred Levins 10/15/2016, 10:17 AM

## 2016-10-25 ENCOUNTER — Inpatient Hospital Stay (HOSPITAL_COMMUNITY)
Admission: AD | Admit: 2016-10-25 | Payer: BC Managed Care – PPO | Source: Ambulatory Visit | Admitting: Obstetrics and Gynecology

## 2017-11-28 LAB — OB RESULTS CONSOLE GC/CHLAMYDIA
Chlamydia: NEGATIVE
Gonorrhea: NEGATIVE

## 2017-11-28 LAB — OB RESULTS CONSOLE RPR: RPR: NONREACTIVE

## 2017-11-28 LAB — OB RESULTS CONSOLE ABO/RH: RH Type: NEGATIVE

## 2017-11-28 LAB — OB RESULTS CONSOLE HEPATITIS B SURFACE ANTIGEN: HEP B S AG: NEGATIVE

## 2017-11-28 LAB — OB RESULTS CONSOLE ANTIBODY SCREEN: Antibody Screen: NEGATIVE

## 2017-11-28 LAB — OB RESULTS CONSOLE RUBELLA ANTIBODY, IGM: Rubella: IMMUNE

## 2017-11-28 LAB — OB RESULTS CONSOLE HIV ANTIBODY (ROUTINE TESTING): HIV: NONREACTIVE

## 2018-06-10 LAB — OB RESULTS CONSOLE GBS: STREP GROUP B AG: NEGATIVE

## 2018-06-17 ENCOUNTER — Observation Stay (HOSPITAL_COMMUNITY)
Admission: RE | Admit: 2018-06-17 | Discharge: 2018-06-17 | Disposition: A | Discharge status: Home / Self Care | Payer: No Typology Code available for payment source | Source: Ambulatory Visit | Attending: Obstetrics and Gynecology | Admitting: Obstetrics and Gynecology

## 2018-06-17 ENCOUNTER — Encounter (HOSPITAL_COMMUNITY): Payer: Self-pay

## 2018-06-17 DIAGNOSIS — Z3A00 Weeks of gestation of pregnancy not specified: Secondary | ICD-10-CM | POA: Insufficient documentation

## 2018-06-17 DIAGNOSIS — O321XX0 Maternal care for breech presentation, not applicable or unspecified: Principal | ICD-10-CM | POA: Insufficient documentation

## 2018-06-17 DIAGNOSIS — O99019 Anemia complicating pregnancy, unspecified trimester: Secondary | ICD-10-CM | POA: Insufficient documentation

## 2018-06-17 MED ORDER — TERBUTALINE SULFATE 1 MG/ML IJ SOLN
0.2500 mg | Freq: Once | INTRAMUSCULAR | Status: AC
  Administered 2018-06-17: 0.25 mg via SUBCUTANEOUS
  Filled 2018-06-17: qty 1

## 2018-06-17 MED ORDER — LACTATED RINGERS IV SOLN
INTRAVENOUS | Status: DC
  Administered 2018-06-17: 08:00:00 via INTRAVENOUS

## 2018-06-17 NOTE — Discharge Summary (Signed)
Physician Discharge Summary  Patient ID: Anne Russell MRN: [id number] DOB/AGE: [birth year] 30 y.o.  Admit date: 06/17/2018 Discharge date: 06/17/2018  Admission Diagnoses:Breech at term  Discharge Diagnoses: Vertex presentation at term Active Problems:   * No active hospital problems. *   Discharged Condition: good  Hospital Course: Pt presented to L&D for ECV attempt.  Successful attempt on first try.  FHR reactive x 1 hour and pt d/c'd home with labor precautions and kickcounts with follow up in office this week  Consults: None  Significant Diagnostic Studies: bedside US  Treatments: ECV  Discharge Exam: Pulse 72, temperature 98.6 F (37 C), temperature source Oral, resp. rate 16, height 5\' 4"  (1.626 m), weight 82 kg, unknown if currently breastfeeding. General appearance: alert, cooperative, appears stated age and no distress gravid nt Vtx presentation at dc  Disposition: Discharge disposition: 01-Home or Self Care       Discharge Instructions    Diet general   Complete by:  As directed      Allergies as of 06/17/2018      Reactions   Sulfa Antibiotics Other (See Comments)   Unknown      Medication List    STOP taking these medications   ibuprofen 600 MG tablet Commonly known as:  ADVIL,MOTRIN     TAKE these medications   cetirizine 10 MG tablet Commonly known as:  ZYRTEC Take 10 mg by mouth daily.   docusate sodium 100 MG capsule Commonly known as:  COLACE Take 1 capsule (100 mg total) by mouth 2 (two) times daily.   ferrous sulfate 325 (65 FE) MG EC tablet Take 1 tablet (325 mg total) by mouth 2 (two) times daily.        Signed: Turner Daniels 06/17/2018, 8:41 AM

## 2018-06-17 NOTE — Procedures (Signed)
External Cephalic Version ? ?Pt was confirmed breech by ultrasound today.  She desires attempt at ECV.  Risks and benefits have been discussed at length and informed consent was obtained. ?EFM with reactive tracing and patient was given terbutaline .25mg SQ.  IV had been placed and Ultrasound at bedside. ? ?Fetal vertex in LUQ was confirmed and fetal buttocks was elevated out of the pelvis.  In a counterclockwise fashion, ECV was carried out with successful placement of fetal vertex in the cephalic position confirmed by ultrasound. ? ?Pt and fetus tolerated the procedure well.  EFM was reassuring immediately after the procedure and the patient was excited and reassured by the procedure. ?Plan to monitor EFM for 1 hour and if remains reassuring will discharge the patient home with routine labor warnings and kickcounts.  Follow up in the office as scheduled next week.  ?

## 2018-06-17 NOTE — H&P (Signed)
Anne Russell is a 32 y.o. female presenting for ECV attempt.  Was seen in the office last week and [place] confirmed Frank breech presentation with EFW 5 1/2 lbs.  Pregnancy complicated by labile BPs recently without sxs of Pre eclampsia.  GBS-. OB History    Gravida  2   Para  1   Term  1   Preterm      AB      Living  1     SAB      TAB      Ectopic      Multiple  0   Live Births  1          Past Medical History:  Diagnosis Date  . Anemia   . Asthma   . Headache   . History of endometriosis   . Hx of varicella   . UTI (lower urinary tract infection)    Past Surgical History:  Procedure Laterality Date  . DIAGNOSTIC LAPAROSCOPY     Family History: family history includes Breast cancer in her maternal aunt and paternal aunt; Colon cancer in her maternal grandmother; Heart attack in her maternal grandfather; Hyperlipidemia in her mother; Kidney Stones in her father; Urinary tract infection in her mother. Social History:  reports that she has never smoked. She has never used smokeless tobacco. She reports that she drank alcohol. She reports that she does not use drugs.     Maternal Diabetes: No Genetic Screening: Normal Maternal Ultrasounds/Referrals: Normal Fetal Ultrasounds or other Referrals:  None Maternal Substance Abuse:  No Significant Maternal Medications:  None Significant Maternal Lab Results:  None Other Comments:  None  ROS History   Pulse 72, temperature 98.6 F (37 C), temperature source Oral, resp. rate 16, height 5\' 4"  (1.626 m), weight 82 kg, unknown if currently breastfeeding. Exam Physical Exam  [place] Homero Fellers Breech with Vtx in LUQ Cx Cl th High   Prenatal labs: ABO, Rh: O/Negative/-- (05/01 0000) Antibody: NEG (05/01 0000) Rubella: Immune (05/01 0000) RPR: Nonreactive (05/01 0000)  HBsAg: Negative (05/01 0000)  HIV: Non-reactive (05/01 0000)  GBS:     Assessment/Plan: IUP at term Breech presentation desires ECV attempt.  R&B  discussed at length and informed consent obtained.   Turner Daniels 06/17/2018, 8:12 AM

## 2018-06-21 ENCOUNTER — Encounter (HOSPITAL_COMMUNITY): Payer: Self-pay | Admitting: *Deleted

## 2018-06-21 ENCOUNTER — Telehealth (HOSPITAL_COMMUNITY): Payer: Self-pay | Admitting: *Deleted

## 2018-06-21 NOTE — Telephone Encounter (Signed)
Preadmission screen  

## 2018-06-21 NOTE — H&P (Signed)
Anne Russell is a 32 y.o. female presenting for IOL for gestational hypertension. BP in office 148/88>140/104. Labs on 06/20/18 noted creat .98, AST/ALT  23/15, plt 126 (122 on 06/14/18). OB History    Gravida  2   Para  1   Term  1   Preterm      AB      Living  1     SAB      TAB      Ectopic      Multiple  0   Live Births  1          Past Medical History:  Diagnosis Date  . Anemia   . Asthma   . Headache   . History of endometriosis   . Hx of varicella   . UTI (lower urinary tract infection)    Past Surgical History:  Procedure Laterality Date  . DIAGNOSTIC LAPAROSCOPY     Family History: family history includes Breast cancer in her maternal aunt and paternal aunt; Colon cancer in her maternal grandmother; Heart attack in her maternal grandfather; Hyperlipidemia in her mother; Kidney Stones in her father; Urinary tract infection in her mother. Social History:  reports that she has never smoked. She has never used smokeless tobacco. She reports that she drank alcohol. She reports that she does not use drugs.     Maternal Diabetes: No Genetic Screening: Normal Maternal Ultrasounds/Referrals: Normal Fetal Ultrasounds or other Referrals:  None Maternal Substance Abuse:  No Significant Maternal Medications:  None Significant Maternal Lab Results:  None Other Comments:  None  Review of Systems  Eyes: Negative for blurred vision.  Gastrointestinal: Negative for abdominal pain.  Neurological: Negative for headaches.   Maternal Medical History:  Fetal activity: Perceived fetal activity is normal.        unknown if currently breastfeeding. Maternal Exam:  Abdomen: Fetal presentation: vertex     Physical Exam  Cardiovascular: Normal rate and regular rhythm.  Respiratory: Effort normal.  GI: Soft.  Neurological: She has normal reflexes.    Cx 1/50/-3/vtx on 06/20/18  Prenatal labs: ABO, Rh: O/Negative/-- (05/01 0000) Antibody: NEG (05/01  0000) Rubella: Immune (05/01 0000) RPR: Nonreactive (05/01 0000)  HBsAg: Negative (05/01 0000)  HIV: Non-reactive (05/01 0000)  GBS: Negative (11/11 0000)   Assessment/Plan: 32 yo G2P1 @ 37 6/7 weeks with gestation hypertension Will check labs and U/S to confirm vtx Two stage IOL   Roselle Locus II 06/21/2018, 10:44 AM

## 2018-06-22 ENCOUNTER — Other Ambulatory Visit: Payer: Self-pay

## 2018-06-22 ENCOUNTER — Inpatient Hospital Stay (HOSPITAL_COMMUNITY): Payer: No Typology Code available for payment source

## 2018-06-22 ENCOUNTER — Encounter (HOSPITAL_COMMUNITY): Payer: Self-pay

## 2018-06-22 ENCOUNTER — Inpatient Hospital Stay (HOSPITAL_COMMUNITY)
Admission: RE | Admit: 2018-06-22 | Discharge: 2018-06-24 | DRG: 806 | Disposition: A | Discharge status: Home / Self Care | Payer: No Typology Code available for payment source | Attending: Obstetrics and Gynecology | Admitting: Obstetrics and Gynecology

## 2018-06-22 ENCOUNTER — Inpatient Hospital Stay (HOSPITAL_BASED_OUTPATIENT_CLINIC_OR_DEPARTMENT_OTHER): Payer: No Typology Code available for payment source

## 2018-06-22 DIAGNOSIS — D696 Thrombocytopenia, unspecified: Secondary | ICD-10-CM | POA: Diagnosis present

## 2018-06-22 DIAGNOSIS — O134 Gestational [pregnancy-induced] hypertension without significant proteinuria, complicating childbirth: Secondary | ICD-10-CM | POA: Diagnosis present

## 2018-06-22 DIAGNOSIS — O9912 Other diseases of the blood and blood-forming organs and certain disorders involving the immune mechanism complicating childbirth: Secondary | ICD-10-CM | POA: Diagnosis present

## 2018-06-22 DIAGNOSIS — O139 Gestational [pregnancy-induced] hypertension without significant proteinuria, unspecified trimester: Secondary | ICD-10-CM | POA: Diagnosis present

## 2018-06-22 DIAGNOSIS — Z3A37 37 weeks gestation of pregnancy: Secondary | ICD-10-CM | POA: Diagnosis not present

## 2018-06-22 DIAGNOSIS — Z3689 Encounter for other specified antenatal screening: Secondary | ICD-10-CM

## 2018-06-22 LAB — COMPREHENSIVE METABOLIC PANEL
ALT: 16 U/L (ref 0–44)
ALT: 17 U/L (ref 0–44)
AST: 32 U/L (ref 15–41)
AST: 27 U/L (ref 15–41)
Albumin: 3.2 g/dL — ABNORMAL LOW (ref 3.5–5.0)
Albumin: 3 g/dL — ABNORMAL LOW (ref 3.5–5.0)
Alkaline Phosphatase: 130 U/L — ABNORMAL HIGH (ref 38–126)
Alkaline Phosphatase: 128 U/L — ABNORMAL HIGH (ref 38–126)
Anion gap: 10 (ref 5–15)
Anion gap: 10 (ref 5–15)
BILIRUBIN TOTAL: 0.6 mg/dL (ref 0.3–1.2)
BUN: 14 mg/dL (ref 6–20)
BUN: 12 mg/dL (ref 6–20)
CHLORIDE: 105 mmol/L (ref 98–111)
CO2: 17 mmol/L — ABNORMAL LOW (ref 22–32)
CO2: 19 mmol/L — ABNORMAL LOW (ref 22–32)
CREATININE: 0.74 mg/dL (ref 0.44–1.00)
Calcium: 9.1 mg/dL (ref 8.9–10.3)
Calcium: 8.7 mg/dL — ABNORMAL LOW (ref 8.9–10.3)
Chloride: 106 mmol/L (ref 98–111)
Creatinine, Ser: 0.78 mg/dL (ref 0.44–1.00)
Glucose, Bld: 100 mg/dL — ABNORMAL HIGH (ref 70–99)
Glucose, Bld: 77 mg/dL (ref 70–99)
POTASSIUM: 4.1 mmol/L (ref 3.5–5.1)
Potassium: 4.2 mmol/L (ref 3.5–5.1)
Sodium: 133 mmol/L — ABNORMAL LOW (ref 135–145)
Sodium: 134 mmol/L — ABNORMAL LOW (ref 135–145)
TOTAL PROTEIN: 6 g/dL — AB (ref 6.5–8.1)
TOTAL PROTEIN: 5.8 g/dL — AB (ref 6.5–8.1)
Total Bilirubin: 0.8 mg/dL (ref 0.3–1.2)

## 2018-06-22 LAB — CBC
HCT: 40.3 % (ref 36.0–46.0)
HCT: 40.9 % (ref 36.0–46.0)
Hemoglobin: 13.8 g/dL (ref 12.0–15.0)
Hemoglobin: 13.6 g/dL (ref 12.0–15.0)
MCH: 30.3 pg (ref 26.0–34.0)
MCH: 30 pg (ref 26.0–34.0)
MCHC: 34.2 g/dL (ref 30.0–36.0)
MCHC: 33.3 g/dL (ref 30.0–36.0)
MCV: 88.6 fL (ref 80.0–100.0)
MCV: 90.3 fL (ref 80.0–100.0)
NRBC: 0 % (ref 0.0–0.2)
PLATELETS: 120 10*3/uL — AB (ref 150–400)
PLATELETS: 111 10*3/uL — AB (ref 150–400)
RBC: 4.55 MIL/uL (ref 3.87–5.11)
RBC: 4.53 MIL/uL (ref 3.87–5.11)
RDW: 13.3 % (ref 11.5–15.5)
RDW: 13.3 % (ref 11.5–15.5)
WBC: 11.3 10*3/uL — ABNORMAL HIGH (ref 4.0–10.5)
WBC: 11 10*3/uL — AB (ref 4.0–10.5)
nRBC: 0 % (ref 0.0–0.2)

## 2018-06-22 LAB — TYPE AND SCREEN
ABO/RH(D): O NEG
Antibody Screen: NEGATIVE

## 2018-06-22 LAB — RPR: RPR Ser Ql: NONREACTIVE

## 2018-06-22 IMAGING — US US MFM OB LIMITED
1 series · 13 of 13 positions shown · non-contrast
Comparison: none

[Series 1: us mfm ob limited · 13 of 13 slices shown]
[im 1/13]
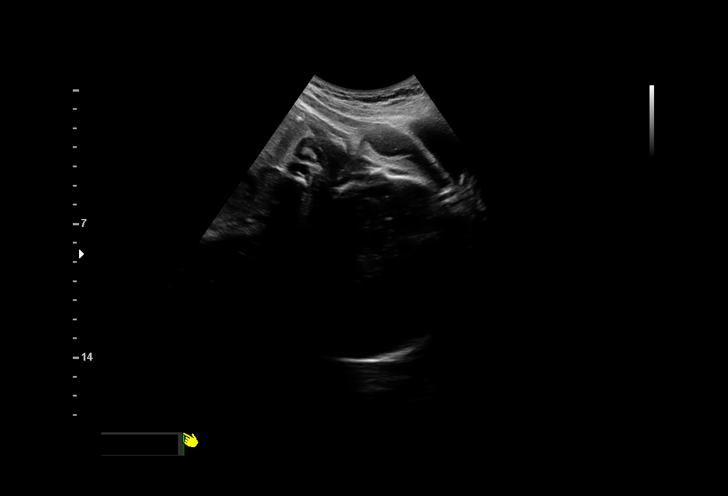
[im 2/13]
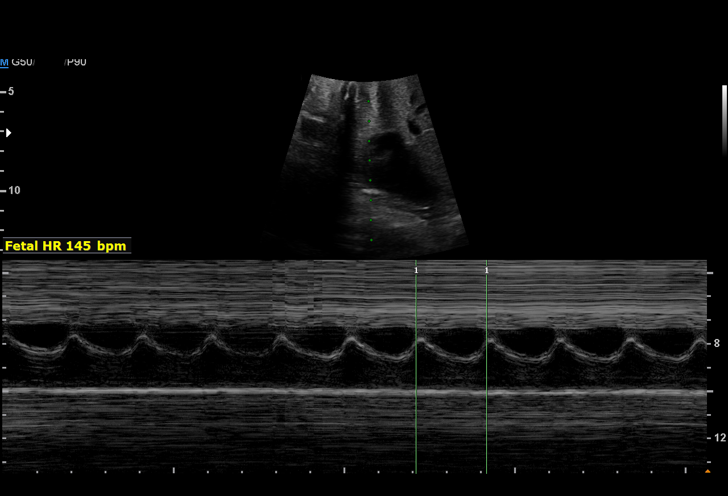
[im 3/13]
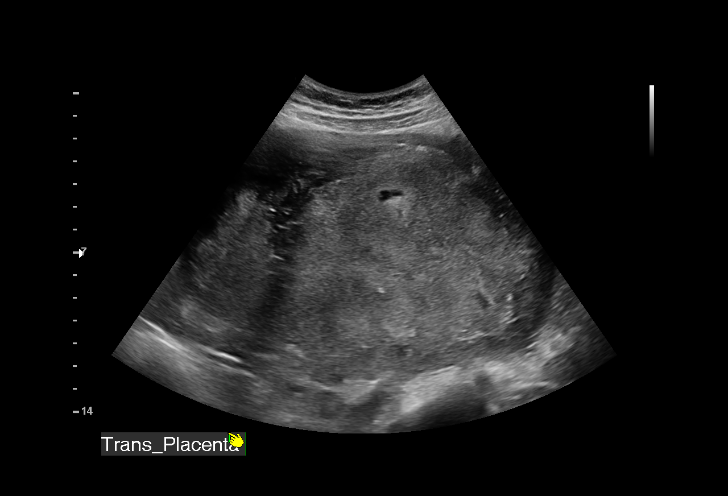
[im 4/13]
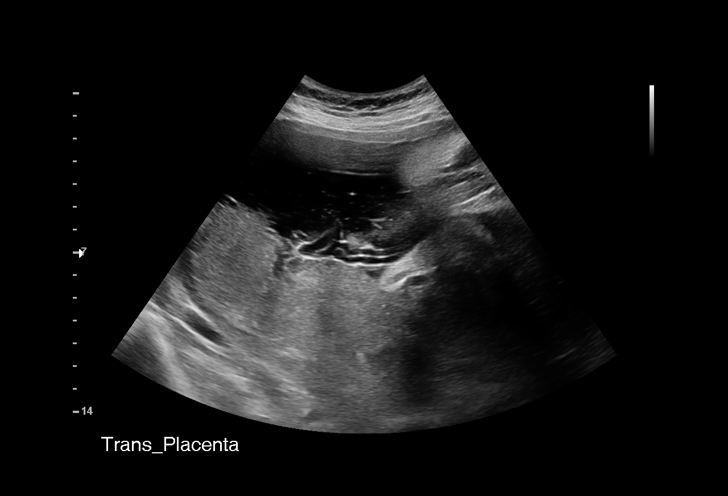
[im 5/13]
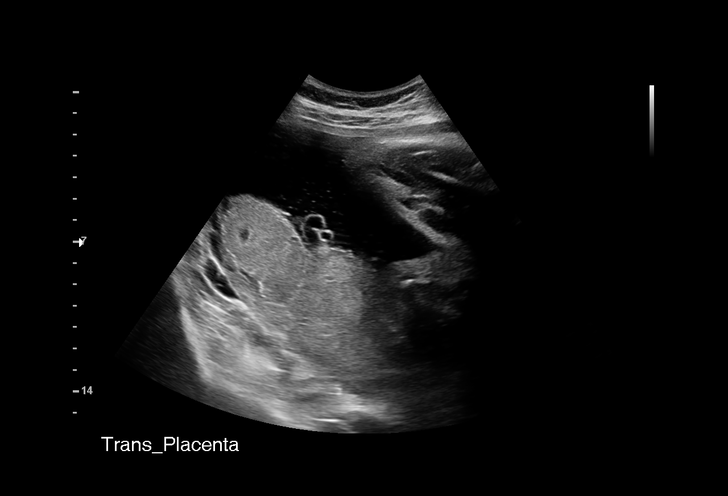
[im 6/13]
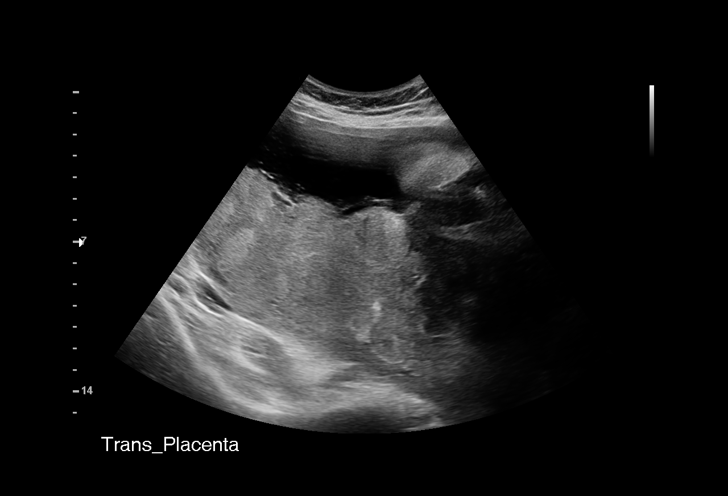
[im 7/13]
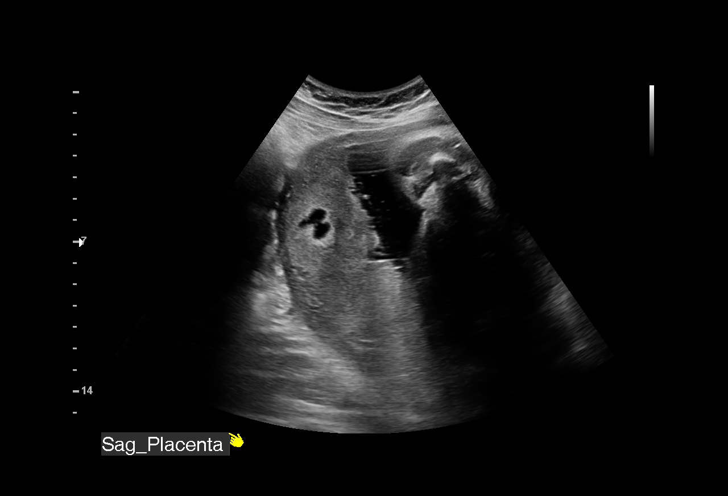
[im 8/13]
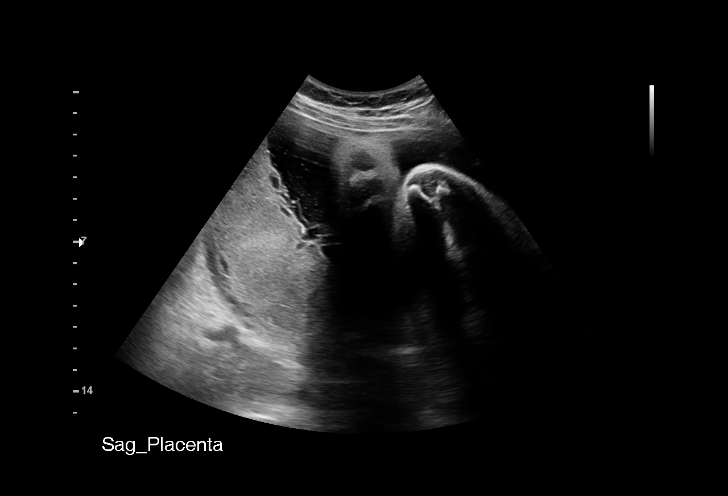
[im 9/13]
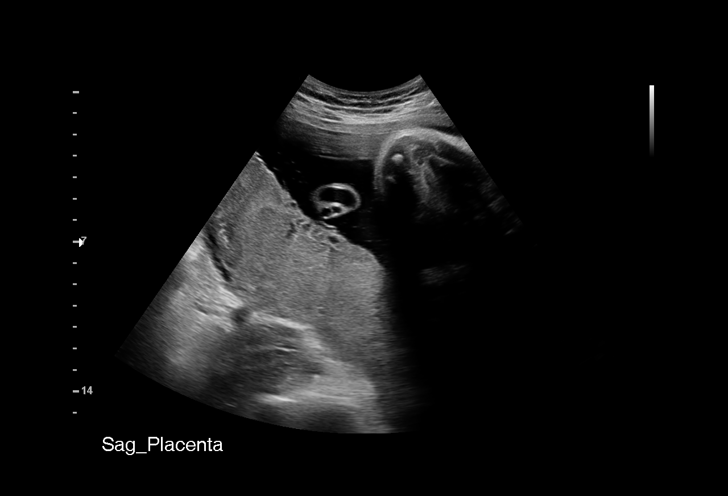
[im 10/13]
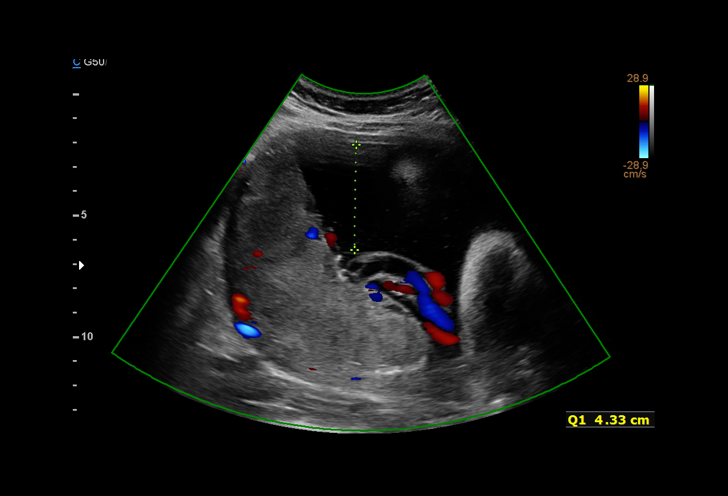
[im 11/13]
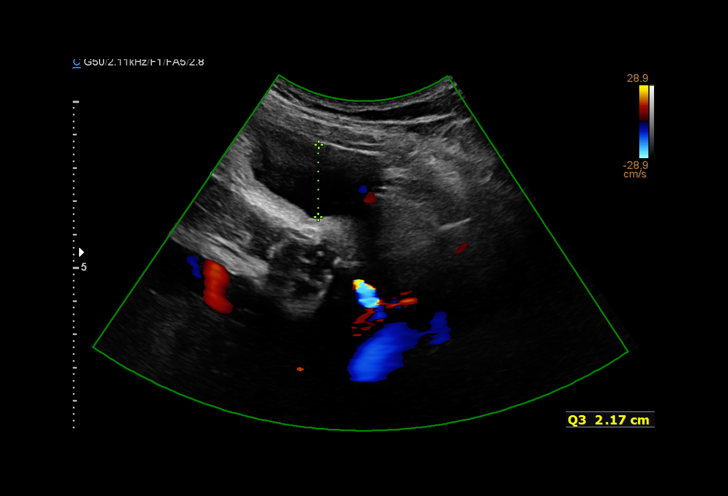
[im 12/13]
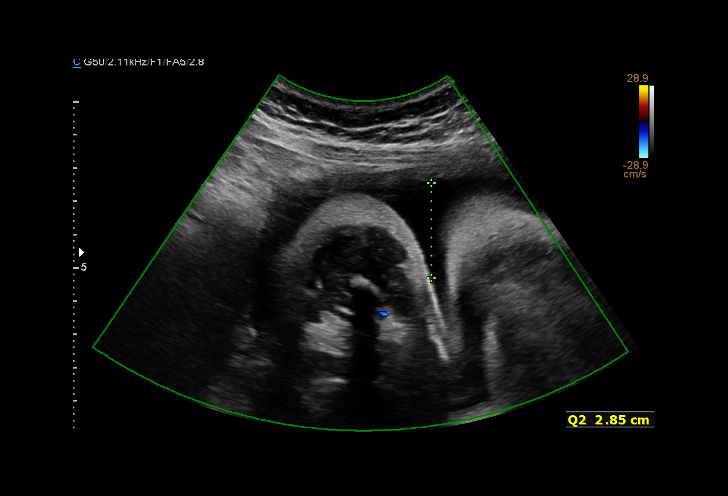
[im 13/13]
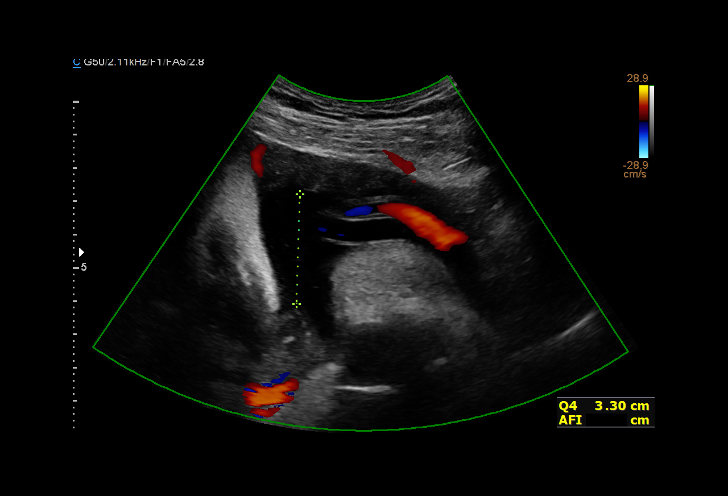

[13 of 13 positions shown; findings below may reference images not displayed]

1  US MFM OB LIMITED                    76815.01     URREA
 ----------------------------------------------------------------------

 ----------------------------------------------------------------------
Indications

  Determine Fetal presentation by ultrasound     [GR]
  37 weeks gestation of pregnancy
 ----------------------------------------------------------------------
Vital Signs

 BMI:
Fetal Evaluation

 Num Of Fetuses:          1
 Fetal Heart Rate(bpm):   145
 Cardiac Activity:        Observed
 Presentation:            Cephalic
 Placenta:                Posterior

 Amniotic Fluid
 AFI FV:      Within normal limits

 AFI Sum(cm)     %Tile       Largest Pocket(cm)
 12.65           45

 RUQ(cm)       RLQ(cm)       LUQ(cm)        LLQ(cm)

OB History

 Gravidity:    2         Term:   1
 Living:       1
Gestational Age
 Clinical EDD:  37w 6d                                        EDD:   [DATE]
 Best:          37w 6d     Det. By:  Clinical EDD             EDD:   [DATE]
Cervix Uterus Adnexa

 Cervix
 Not visualized (advanced GA >[GR])
Impression

 A limited ultrasound study was performed. Amniotic fluid is
 normal and good fetal activity is seen. Cephalic presentation.
                 URREA

## 2018-06-22 MED ORDER — OXYCODONE-ACETAMINOPHEN 5-325 MG PO TABS: 2.0000 | ORAL_TABLET | ORAL | Status: DC | PRN

## 2018-06-22 MED ORDER — ACETAMINOPHEN 325 MG PO TABS
650.0000 mg | ORAL_TABLET | ORAL | Status: DC | PRN
  Administered 2018-06-23 – 2018-06-24 (×4): 650 mg via ORAL
  Filled 2018-06-22 (×4): qty 2

## 2018-06-22 MED ORDER — FLEET ENEMA 7-19 GM/118ML RE ENEM: 1.0000 | ENEMA | RECTAL | Status: DC | PRN

## 2018-06-22 MED ORDER — OXYTOCIN 40 UNITS IN LACTATED RINGERS INFUSION - SIMPLE MED
1.0000 m[IU]/min | INTRAVENOUS | Status: DC
  Administered 2018-06-22: 2 m[IU]/min via INTRAVENOUS
  Filled 2018-06-22: qty 1000

## 2018-06-22 MED ORDER — ONDANSETRON HCL 4 MG/2ML IJ SOLN: 4.0000 mg | INTRAMUSCULAR | Status: DC | PRN

## 2018-06-22 MED ORDER — ZOLPIDEM TARTRATE 5 MG PO TABS: 5.0000 mg | ORAL_TABLET | Freq: Every evening | ORAL | Status: DC | PRN

## 2018-06-22 MED ORDER — LACTATED RINGERS IV SOLN
INTRAVENOUS | Status: DC
  Administered 2018-06-22 (×2): via INTRAVENOUS

## 2018-06-22 MED ORDER — ONDANSETRON HCL 4 MG/2ML IJ SOLN: 4.0000 mg | Freq: Four times a day (QID) | INTRAMUSCULAR | Status: DC | PRN

## 2018-06-22 MED ORDER — SOD CITRATE-CITRIC ACID 500-334 MG/5ML PO SOLN: 30.0000 mL | ORAL | Status: DC | PRN

## 2018-06-22 MED ORDER — TERBUTALINE SULFATE 1 MG/ML IJ SOLN: 0.2500 mg | Freq: Once | INTRAMUSCULAR | Status: DC | PRN

## 2018-06-22 MED ORDER — PRENATAL MULTIVITAMIN CH
1.0000 | ORAL_TABLET | Freq: Every day | ORAL | Status: DC
  Administered 2018-06-23: 1 via ORAL
  Filled 2018-06-22: qty 1

## 2018-06-22 MED ORDER — OXYTOCIN BOLUS FROM INFUSION
500.0000 mL | Freq: Once | INTRAVENOUS | Status: DC
  Administered 2018-06-22: 500 mL via INTRAVENOUS

## 2018-06-22 MED ORDER — OXYCODONE HCL 5 MG PO TABS: 10.0000 mg | ORAL_TABLET | ORAL | Status: DC | PRN

## 2018-06-22 MED ORDER — COCONUT OIL OIL
1.0000 "application " | TOPICAL_OIL | Status: DC | PRN
  Filled 2018-06-22: qty 120

## 2018-06-22 MED ORDER — SENNOSIDES-DOCUSATE SODIUM 8.6-50 MG PO TABS
2.0000 | ORAL_TABLET | ORAL | Status: DC
  Administered 2018-06-23 (×2): 2 via ORAL
  Filled 2018-06-22 (×2): qty 2

## 2018-06-22 MED ORDER — MISOPROSTOL 25 MCG QUARTER TABLET
25.0000 ug | ORAL_TABLET | ORAL | Status: DC | PRN
  Administered 2018-06-22 (×2): 25 ug via VAGINAL
  Filled 2018-06-22 (×2): qty 1

## 2018-06-22 MED ORDER — IBUPROFEN 600 MG PO TABS
600.0000 mg | ORAL_TABLET | Freq: Four times a day (QID) | ORAL | Status: DC
  Administered 2018-06-22 – 2018-06-23 (×3): 600 mg via ORAL
  Filled 2018-06-22 (×3): qty 1

## 2018-06-22 MED ORDER — BENZOCAINE-MENTHOL 20-0.5 % EX AERO
1.0000 "application " | INHALATION_SPRAY | CUTANEOUS | Status: DC | PRN
  Administered 2018-06-22 – 2018-06-24 (×2): 1 via TOPICAL
  Filled 2018-06-22 (×3): qty 56

## 2018-06-22 MED ORDER — LIDOCAINE HCL (PF) 1 % IJ SOLN
30.0000 mL | INTRAMUSCULAR | Status: DC | PRN
  Administered 2018-06-22: 30 mL via SUBCUTANEOUS
  Filled 2018-06-22: qty 30

## 2018-06-22 MED ORDER — OXYCODONE HCL 5 MG PO TABS: 5.0000 mg | ORAL_TABLET | ORAL | Status: DC | PRN

## 2018-06-22 MED ORDER — OXYCODONE-ACETAMINOPHEN 5-325 MG PO TABS: 1.0000 | ORAL_TABLET | ORAL | Status: DC | PRN

## 2018-06-22 MED ORDER — LACTATED RINGERS IV SOLN: 500.0000 mL | INTRAVENOUS | Status: DC | PRN

## 2018-06-22 MED ORDER — WITCH HAZEL-GLYCERIN EX PADS
1.0000 "application " | MEDICATED_PAD | CUTANEOUS | Status: DC | PRN
  Administered 2018-06-23 – 2018-06-24 (×2): 1 via TOPICAL

## 2018-06-22 MED ORDER — DIPHENHYDRAMINE HCL 25 MG PO CAPS: 25.0000 mg | ORAL_CAPSULE | Freq: Four times a day (QID) | ORAL | Status: DC | PRN

## 2018-06-22 MED ORDER — ONDANSETRON HCL 4 MG PO TABS: 4.0000 mg | ORAL_TABLET | ORAL | Status: DC | PRN

## 2018-06-22 MED ORDER — BUTORPHANOL TARTRATE 1 MG/ML IJ SOLN: 1.0000 mg | INTRAMUSCULAR | Status: DC | PRN

## 2018-06-22 MED ORDER — SIMETHICONE 80 MG PO CHEW: 80.0000 mg | CHEWABLE_TABLET | ORAL | Status: DC | PRN

## 2018-06-22 MED ORDER — DIBUCAINE 1 % RE OINT
1.0000 "application " | TOPICAL_OINTMENT | RECTAL | Status: DC | PRN
  Administered 2018-06-23 – 2018-06-24 (×2): 1 via RECTAL
  Filled 2018-06-22 (×3): qty 28

## 2018-06-22 MED ORDER — OXYTOCIN 40 UNITS IN LACTATED RINGERS INFUSION - SIMPLE MED: 2.5000 [IU]/h | INTRAVENOUS | Status: DC

## 2018-06-22 MED ORDER — TETANUS-DIPHTH-ACELL PERTUSSIS 5-2.5-18.5 LF-MCG/0.5 IM SUSP
0.5000 mL | Freq: Once | INTRAMUSCULAR | Status: DC
  Filled 2018-06-22: qty 0.5

## 2018-06-22 MED ORDER — ACETAMINOPHEN 325 MG PO TABS: 650.0000 mg | ORAL_TABLET | ORAL | Status: DC | PRN

## 2018-06-22 MED ORDER — HYDROXYZINE HCL 50 MG PO TABS: 50.0000 mg | ORAL_TABLET | Freq: Four times a day (QID) | ORAL | Status: DC | PRN

## 2018-06-22 NOTE — Progress Notes (Signed)
FHT cat one Cx 3/90/-2 AROM clear Start pitocin>D/W patient

## 2018-06-22 NOTE — Anesthesia Pain Management Evaluation Note (Signed)
  CRNA Pain Management Visit Note  Patient: Anne Russell, 32 y.o., female  "Hello I am a member of the anesthesia team at [facility]. We have an anesthesia team available at all times to provide care throughout the hospital, including epidural management and anesthesia for C-section. I don't know your plan for the delivery whether it a natural birth, water birth, IV sedation, nitrous supplementation, doula or epidural, but we want to meet your pain goals."   1.Was your pain managed to your expectations on prior hospitalizations?   Yes   2.What is your expectation for pain management during this hospitalization?     Labor support without medications  3.How can we help you reach that goal? Nursing support  Record the patient's initial score and the patient's pain goal.   Pain:   Pain Goal: pt delivering naturally [facility] wants you to be able to say your pain was always managed very well.  Salome Arnt 06/22/2018

## 2018-06-22 NOTE — Progress Notes (Signed)
Dr. Henderson Cloud notified of SBP>140 and DBP>90 (patient in this range during L&D). Parameters to call MD changed to SBP>150 and DBP>100.

## 2018-06-22 NOTE — Progress Notes (Signed)
No changes to H&O per patient history No HA, no vision change, no epigastric pain  Vitals:   06/22/18 0534 06/22/18 0537  BP: (!) 143/97 (!) 143/97  Pulse: 66 66  Resp: 16   Temp:  97.7 F (36.5 C)   Lungs CTA Cor RRR Abdomen no epigastric tenderness DTR 2+ with out clonus  Results for orders placed or performed during the hospital encounter of 06/22/18 (from the past 24 hour(s))  CBC     Status: Abnormal   Collection Time: 06/22/18  1:15 AM  Result Value Ref Range   WBC 11.3 (H) 4.0 - 10.5 K/uL   RBC 4.55 3.87 - 5.11 MIL/uL   Hemoglobin 13.8 12.0 - 15.0 g/dL   HCT 09.8 11.9 - 14.7 %   MCV 88.6 80.0 - 100.0 fL   MCH 30.3 26.0 - 34.0 pg   MCHC 34.2 30.0 - 36.0 g/dL   RDW 82.9 56.2 - 13.0 %   Platelets 120 (L) 150 - 400 K/uL   nRBC 0.0 0.0 - 0.2 %  Type and screen [facility]     Status: None   Collection Time: 06/22/18  1:15 AM  Result Value Ref Range   ABO/RH(D) O NEG    Antibody Screen NEG    Sample Expiration      06/25/2018 Performed at [facility], [address] [place], [place] [address]   Comprehensive metabolic panel     Status: Abnormal   Collection Time: 06/22/18  1:15 AM  Result Value Ref Range   Sodium 133 (L) 135 - 145 mmol/L   Potassium 4.1 3.5 - 5.1 mmol/L   Chloride 106 98 - 111 mmol/L   CO2 17 (L) 22 - 32 mmol/L   Glucose, Bld 100 (H) 70 - 99 mg/dL   BUN 14 6 - 20 mg/dL   Creatinine, Ser 4.69 0.44 - 1.00 mg/dL   Calcium 9.1 8.9 - 62.9 mg/dL   Total Protein 6.0 (L) 6.5 - 8.1 g/dL   Albumin 3.2 (L) 3.5 - 5.0 g/dL   AST 32 15 - 41 U/L   ALT 16 0 - 44 U/L   Alkaline Phosphatase 130 (H) 38 - 126 U/L   Total Bilirubin 0.6 0.3 - 1.2 mg/dL   GFR calc non Af Amer >60 >60 mL/min   GFR calc Af Amer >60 >60 mL/min   Anion gap 10 5 - 15    A/P: gestational hypertension         Repeat labs          D/W patient and husband two stage IOL

## 2018-06-22 NOTE — Lactation Note (Signed)
This note was copied from a baby's chart. Lactation Consultation Note  Patient Name: Anne Russell [id number] Date: 06/22/2018 Reason for consult: Initial assessment;Early term 26-38.6wks  5 hours old early term female who is being exclusively BF by his mother, she's a P2. Mom is experienced BF, she was able to BF her first child for 12 months. She had an oversupply of milk, mom said she just run out of her frozen milk a couple of months ago when her oldest one was 29 months old; she had a deep freezer full of EBM. The only BF difficulty that she faced in the beginning is that she was very short shafted (semi-flat nipples) so she had to use a nipple shield for the first 3-4 weeks due to a difficult latch.  Offered assistance with latch, but mom politely declined stating that baby already fed; he was asleep. She was concerns that he's been very sleepy, discussed newborn behavior during the first 24 hours. Cluster feeding was also discussed. Asked mom to call for assistance when needed.  When reviewing hand expression with mom colostrum easily flowed out of both breasts, LC also set mom up with a hand pump and breast shells, instructions, cleaning and storage were reviewed. Mom wasn't familiar with hand pump and requested assistance to [facility]. LC showed mom how to pump, she did for 2-3 minutes and already got 2-3 cc of colostrum. LC explained to mom that the purpose of pumping is to evert her nipples and not to get volume at this point, praised her for her efforts. LC showed parents and GOB to finger feed baby with EBM.  Feeding plan:  1. Encouraged mom to feed baby STS 8-12 times/24 hours or sooner if feeding cues are present 2. Hand expression and finger/spoon feeding was also encouraged 3. Mom will pre-pump for 5 minutes prior feedings 4. She'll start wearing her breastshells tomorrow morning in between feedings  BF brochure, BF resources and feeding diary were reviewed. Mom reported all questions  and concerns were answered, she's aware of LC services and will call PRN.  Maternal Data Formula Feeding for Exclusion: No Has patient been taught Hand Expression?: Yes Does the patient have breastfeeding experience prior to this delivery?: Yes  Feeding Feeding Type: Breast Fed  LATCH Score Latch: Repeated attempts needed to sustain latch, nipple held in mouth throughout feeding, stimulation needed to elicit sucking reflex.  Audible Swallowing: A few with stimulation  Type of Nipple: Everted at rest and after stimulation  Comfort (Breast/Nipple): Soft / non-tender  Hold (Positioning): No assistance needed to correctly position infant at breast.  LATCH Score: 8  Interventions Interventions: Breast feeding basics reviewed;Hand express;Breast compression;Hand pump;Shells;Breast massage  Lactation Tools Discussed/Used Tools: Shells;Pump Shell Type: Inverted Breast pump type: Manual WIC Program: No Pump Review: Setup, frequency, and cleaning;Milk Storage Initiated by:: MPeck Date initiated:: 06/22/18   Consult Status Consult Status: Follow-up Date: 06/23/18 Follow-up type: In-patient    Anne Russell 06/22/2018, 6:26 PM

## 2018-06-22 NOTE — Progress Notes (Signed)
Delivery Note At 1:03 PM a viable female was delivered via Vaginal, Spontaneous (Presentation: ;  ).  APGAR: , ; weight  .   Placenta status:intact , .  Cord: 3 vessels  with the following complications:nuchal x 1 .  Cord pH: pending  Anesthesia:  none Episiotomy: None Lacerations:  Second degree ML lac repaired Suture Repair: 2.0 vicryl rapide Est. Blood Loss (mL):  400  Mom to postpartum.  Baby to Couplet care / Skin to Skin.  Roselle Locus II 06/22/2018, 1:18 PM

## 2018-06-23 LAB — COMPREHENSIVE METABOLIC PANEL
ALBUMIN: 2.8 g/dL — AB (ref 3.5–5.0)
ALK PHOS: 108 U/L (ref 38–126)
ALT: 16 U/L (ref 0–44)
ANION GAP: 8 (ref 5–15)
AST: 30 U/L (ref 15–41)
BUN: 16 mg/dL (ref 6–20)
CO2: 19 mmol/L — AB (ref 22–32)
Calcium: 8.8 mg/dL — ABNORMAL LOW (ref 8.9–10.3)
Chloride: 109 mmol/L (ref 98–111)
Creatinine, Ser: 0.8 mg/dL (ref 0.44–1.00)
GFR calc Af Amer: >60 mL/min (ref >60)
GFR calc non Af Amer: >60 mL/min (ref >60)
GLUCOSE: 85 mg/dL (ref 70–99)
Potassium: 4.5 mmol/L (ref 3.5–5.1)
Sodium: 136 mmol/L (ref 135–145)
TOTAL PROTEIN: 5.2 g/dL — AB (ref 6.5–8.1)
Total Bilirubin: 0.4 mg/dL (ref 0.3–1.2)

## 2018-06-23 LAB — CBC
HEMATOCRIT: 35 % — AB (ref 36.0–46.0)
HEMOGLOBIN: 11.5 g/dL — AB (ref 12.0–15.0)
MCH: 29.8 pg (ref 26.0–34.0)
MCHC: 32.9 g/dL (ref 30.0–36.0)
MCV: 90.7 fL (ref 80.0–100.0)
NRBC: 0 % (ref 0.0–0.2)
Platelets: 103 10*3/uL — ABNORMAL LOW (ref 150–400)
RBC: 3.86 MIL/uL — ABNORMAL LOW (ref 3.87–5.11)
RDW: 13.4 % (ref 11.5–15.5)
WBC: 13.5 10*3/uL — AB (ref 4.0–10.5)

## 2018-06-23 NOTE — Progress Notes (Signed)
During Dr Tomblin's rounding this am, this RN present. Dr Henderson Cloud discussed BP values since being admitted, need for further lab work, and holding Motrin based on platelets of 111. Patient and husband understand POC and have no stated concerns.

## 2018-06-23 NOTE — Progress Notes (Signed)
Post Partum Day 1 Subjective: no complaints, up ad lib, voiding, tolerating PO and + flatus  No HA, no vision change, no epigastric pain  Objective: Blood pressure (!) 134/92, pulse 72, temperature 98.6 F (37 C), temperature source Oral, resp. rate 18, height 5\' 4"  (1.626 m), weight 83.9 kg, unknown if currently breastfeeding.  Physical Exam:  General: alert, cooperative and no distress Lochia: appropriate Uterine Fundus: firm Incision: healing well DVT Evaluation: No evidence of DVT seen on physical exam. Lungs CTA DTR 2+ without clonus  Recent Labs    06/22/18 0924 06/23/18 0726  HGB 13.6 11.5*  HCT 40.9 35.0*   Results for orders placed or performed during the hospital encounter of 06/22/18 (from the past 24 hour(s))  CBC     Status: Abnormal   Collection Time: 06/22/18  9:24 AM  Result Value Ref Range   WBC 11.0 (H) 4.0 - 10.5 K/uL   RBC 4.53 3.87 - 5.11 MIL/uL   Hemoglobin 13.6 12.0 - 15.0 g/dL   HCT 34.3 56.8 - 61.6 %   MCV 90.3 80.0 - 100.0 fL   MCH 30.0 26.0 - 34.0 pg   MCHC 33.3 30.0 - 36.0 g/dL   RDW 83.7 29.0 - 21.1 %   Platelets 111 (L) 150 - 400 K/uL   nRBC 0.0 0.0 - 0.2 %  Comprehensive metabolic panel     Status: Abnormal   Collection Time: 06/22/18  9:24 AM  Result Value Ref Range   Sodium 134 (L) 135 - 145 mmol/L   Potassium 4.2 3.5 - 5.1 mmol/L   Chloride 105 98 - 111 mmol/L   CO2 19 (L) 22 - 32 mmol/L   Glucose, Bld 77 70 - 99 mg/dL   BUN 12 6 - 20 mg/dL   Creatinine, Ser 1.55 0.44 - 1.00 mg/dL   Calcium 8.7 (L) 8.9 - 10.3 mg/dL   Total Protein 5.8 (L) 6.5 - 8.1 g/dL   Albumin 3.0 (L) 3.5 - 5.0 g/dL   AST 27 15 - 41 U/L   ALT 17 0 - 44 U/L   Alkaline Phosphatase 128 (H) 38 - 126 U/L   Total Bilirubin 0.8 0.3 - 1.2 mg/dL   GFR calc non Af Amer >60 >60 mL/min   GFR calc Af Amer >60 >60 mL/min   Anion gap 10 5 - 15  CBC     Status: Abnormal   Collection Time: 06/23/18  7:26 AM  Result Value Ref Range   WBC 13.5 (H) 4.0 - 10.5 K/uL   RBC  3.86 (L) 3.87 - 5.11 MIL/uL   Hemoglobin 11.5 (L) 12.0 - 15.0 g/dL   HCT 20.8 (L) 02.2 - 33.6 %   MCV 90.7 80.0 - 100.0 fL   MCH 29.8 26.0 - 34.0 pg   MCHC 32.9 30.0 - 36.0 g/dL   RDW 12.2 44.9 - 75.3 %   Platelets 103 (L) 150 - 400 K/uL   nRBC 0.0 0.0 - 0.2 %    Assessment/Plan: Plan for discharge tomorrow  Labile BP but no sustained hypertension No symptoms of preeclampsia Thrombocytopenia  Check CMET today, CBC in am Stop ibuprofen D/W patient  LOS: 1 day   Anne Russell 06/23/2018, 8:33 AM

## 2018-06-23 NOTE — Lactation Note (Signed)
This note was copied from a baby's chart. Lactation Consultation Note  Patient Name: Anne Russell [id number] Date: 06/23/2018 Reason for consult: Follow-up assessment;Early term 4-38.6wks  P2 mother whose infant is now 59 hours old.  Mother breast fed her first child for 12 months.  Baby was getting a circumcision when I arrived.  Parents stated that he has been feeding approximately every 2- 2 1/2 hours throughout the night.  Mother is pleased with how well he has been latching stating that he is completely "different than my first."  She had difficulties with her first child latching but is satisfied with this baby.  Her breasts are soft and non tender and nipples are short shafted.  Reminded her to wear her breast shells all day today in between feedings.  Reviewed the manual pump and how this can help evert nipples.  Discussed what to expect after circumcision if baby is sleepy.  Mother will do STS and awaken every three hours if he does not self awaken.  She will hand express and feed back any EBM she obtains to baby.  Mother has a DEBP for home use.  She will call for questions or latch assistance as needed.  Father present and supportive.   Maternal Data Formula Feeding for Exclusion: No Has patient been taught Hand Expression?: Yes Does the patient have breastfeeding experience prior to this delivery?: Yes  Feeding    LATCH Score                   Interventions    Lactation Tools Discussed/Used WIC Program: No Pump Review: Setup, frequency, and cleaning Initiated by:: Reviewed with mother   Consult Status Consult Status: Follow-up Date: 06/24/18 Follow-up type: In-patient    Amali Uhls R Rayelynn Loyal 06/23/2018, 10:10 AM

## 2018-06-24 LAB — CBC
HEMATOCRIT: 35.2 % — AB (ref 36.0–46.0)
HEMOGLOBIN: 11.4 g/dL — AB (ref 12.0–15.0)
MCH: 29.5 pg (ref 26.0–34.0)
MCHC: 32.4 g/dL (ref 30.0–36.0)
MCV: 91.2 fL (ref 80.0–100.0)
Platelets: 126 10*3/uL — ABNORMAL LOW (ref 150–400)
RBC: 3.86 MIL/uL — AB (ref 3.87–5.11)
RDW: 13.6 % (ref 11.5–15.5)
WBC: 11.7 10*3/uL — ABNORMAL HIGH (ref 4.0–10.5)
nRBC: 0 % (ref 0.0–0.2)

## 2018-06-24 NOTE — Discharge Instructions (Signed)
Call MD for T>100.4, heavy vaginal bleeding, severe abdominal pain, or respiratory distress.  Call office to schedule postpartum visit in 6 weeks.  Pelvic rest x 6 weeks.   

## 2018-06-24 NOTE — Progress Notes (Signed)
Post Partum Day 2 Subjective: no complaints, up ad lib, voiding and no HA, CP/SOB, RUQ pain, or vision change  Objective: Blood pressure 134/77, pulse 64, temperature 97.7 F (36.5 C), temperature source Oral, resp. rate 16, height 5\' 4"  (1.626 m), weight 83.9 kg, SpO2 100 %, unknown if currently breastfeeding.  Physical Exam:  General: alert, cooperative and appears stated age Lochia: appropriate Uterine Fundus: firm Incision: healing well, no significant drainage, no dehiscence DVT Evaluation: No evidence of DVT seen on physical exam. Negative Homan's sign. No cords or calf tenderness.  Recent Labs    06/23/18 0726 06/24/18 0557  HGB 11.5* 11.4*  HCT 35.0* 35.2*   PLT 103->126  Assessment/Plan: Plan for discharge tomorrow  GHTN-no symptoms, labs normalizing   LOS: 2 days   Anne Russell 06/24/2018, 8:46 AM

## 2018-06-24 NOTE — Lactation Note (Signed)
This note was copied from a baby's chart. Lactation Consultation Note  Patient Name: Anne Russell [id number] Date: 06/24/2018 Reason for consult: Follow-up assessment;Early term 37-38.6wks;Infant weight loss(7% weight loss/ updated the doc flow sheets per mom / dad )  Baby is 30 hours old  As LC entered the room , baby resting on moms chest sleeping in bed and dad present.  Per mom baby last fed at 0807 for 15 mins and baby cluster fed on and off all night.  LC reviewed and updated with mom and dad. ( WNL for D/C )  Per mom has a DEBP and a hand pump for home/ also shells.  Sore nipple and engorgement prevention and tx reviewed.  Mother informed of post-discharge support and given phone number to the lactation department, including services for phone call assistance; out-patient appointments; and breastfeeding support group. List of other breastfeeding resources in the community given in the handout. Encouraged mother to call for problems or concerns related to breastfeeding.  Per mom will probably F/U with [name] for [facility] assistance and weight checks.    Maternal Data    Feeding Feeding Type: (per mom baby last fed at 0807 )  LATCH Score                   Interventions Interventions: Breast feeding basics reviewed  Lactation Tools Discussed/Used     Consult Status Consult Status: Follow-up Date: (per mom will probably f/U with [name] [facility] ) ) Follow-up type: Out-patient    [name] 06/24/2018, 9:20 AM

## 2018-06-24 NOTE — Discharge Summary (Signed)
Obstetric Discharge Summary Reason for Admission: induction of labor Prenatal Procedures: none Intrapartum Procedures: spontaneous vaginal delivery Postpartum Procedures: none Complications-Operative and Postpartum: 2nd degree perineal laceration Hemoglobin  Date Value Ref Range Status  06/24/2018 11.4 (L) 12.0 - 15.0 g/dL Final   HCT  Date Value Ref Range Status  06/24/2018 35.2 (L) 36.0 - 46.0 % Final    Physical Exam:  General: alert, cooperative and appears stated age 32: appropriate Uterine Fundus: firm Incision: healing well, no significant drainage, no dehiscence DVT Evaluation: No evidence of DVT seen on physical exam. Negative Homan's sign. No cords or calf tenderness.  Discharge Diagnoses: Term Pregnancy-delivered and gestational hypertension  Discharge Information: Date: 06/24/2018 Activity: pelvic rest Diet: routine Medications: PNV and Ibuprofen Condition: stable Instructions: refer to practice specific booklet Discharge to: home   Newborn Data: Live born female  Birth Weight: 6 lb 3.7 oz (2825 g) APGAR: 9, 9  Newborn Delivery   Birth date/time:  06/[birth year]/2018 13:03:00 Delivery type:  Vaginal, Spontaneous     Home with mother.  Anne Russell 06/24/2018, 8:49 AM

## 2018-07-29 ENCOUNTER — Inpatient Hospital Stay (HOSPITAL_COMMUNITY): Admit: 2018-07-29 | Payer: No Typology Code available for payment source | Admitting: Obstetrics and Gynecology
